# Patient Record
Sex: Male | Born: 1970 | Race: Black or African American | Hispanic: No | Marital: Single | Smoking: Never smoker
Health system: Southern US, Community
[De-identification: ages and names within clinical notes are randomized; demographics above are authoritative.]

## PROBLEM LIST (undated history)

## (undated) DIAGNOSIS — K219 Gastro-esophageal reflux disease without esophagitis: Secondary | ICD-10-CM

## (undated) DIAGNOSIS — G43909 Migraine, unspecified, not intractable, without status migrainosus: Secondary | ICD-10-CM

## (undated) DIAGNOSIS — R599 Enlarged lymph nodes, unspecified: Secondary | ICD-10-CM

## (undated) DIAGNOSIS — I309 Acute pericarditis, unspecified: Secondary | ICD-10-CM

## (undated) HISTORY — DX: Enlarged lymph nodes, unspecified: R59.9

## (undated) HISTORY — DX: Migraine, unspecified, not intractable, without status migrainosus: G43.909

## (undated) HISTORY — DX: Acute pericarditis, unspecified: I30.9

## (undated) HISTORY — DX: Gastro-esophageal reflux disease without esophagitis: K21.9

## (undated) HISTORY — PX: HERNIA REPAIR: SHX51

## (undated) HISTORY — PX: OTHER SURGICAL HISTORY: SHX169

---

## 1998-10-20 ENCOUNTER — Emergency Department (HOSPITAL_COMMUNITY): Admission: EM | Admit: 1998-10-20 | Discharge: 1998-10-20 | Payer: Self-pay | Admitting: Emergency Medicine

## 2001-10-25 ENCOUNTER — Emergency Department (HOSPITAL_COMMUNITY): Admission: EM | Admit: 2001-10-25 | Discharge: 2001-10-25 | Payer: Self-pay | Admitting: *Deleted

## 2001-10-25 ENCOUNTER — Encounter: Payer: Self-pay | Admitting: Emergency Medicine

## 2001-10-25 ENCOUNTER — Encounter: Payer: Self-pay | Admitting: Orthopedic Surgery

## 2001-10-28 ENCOUNTER — Ambulatory Visit (HOSPITAL_BASED_OUTPATIENT_CLINIC_OR_DEPARTMENT_OTHER): Admission: RE | Admit: 2001-10-28 | Discharge: 2001-10-28 | Payer: Self-pay | Admitting: Orthopedic Surgery

## 2002-11-06 ENCOUNTER — Emergency Department (HOSPITAL_COMMUNITY): Admission: EM | Admit: 2002-11-06 | Discharge: 2002-11-06 | Payer: Self-pay

## 2003-12-22 ENCOUNTER — Emergency Department (HOSPITAL_COMMUNITY): Admission: EM | Admit: 2003-12-22 | Discharge: 2003-12-22 | Payer: Self-pay | Admitting: Emergency Medicine

## 2003-12-24 ENCOUNTER — Emergency Department (HOSPITAL_COMMUNITY): Admission: EM | Admit: 2003-12-24 | Discharge: 2003-12-25 | Payer: Self-pay | Admitting: Emergency Medicine

## 2004-01-03 ENCOUNTER — Emergency Department (HOSPITAL_COMMUNITY): Admission: EM | Admit: 2004-01-03 | Discharge: 2004-01-03 | Payer: Self-pay | Admitting: Emergency Medicine

## 2004-02-05 ENCOUNTER — Observation Stay (HOSPITAL_COMMUNITY): Admission: EM | Admit: 2004-02-05 | Discharge: 2004-02-06 | Payer: Self-pay | Admitting: Emergency Medicine

## 2004-02-06 ENCOUNTER — Encounter: Payer: Self-pay | Admitting: Cardiology

## 2004-02-20 ENCOUNTER — Encounter: Admission: RE | Admit: 2004-02-20 | Discharge: 2004-02-20 | Payer: Self-pay | Admitting: Internal Medicine

## 2004-03-12 ENCOUNTER — Emergency Department (HOSPITAL_COMMUNITY): Admission: EM | Admit: 2004-03-12 | Discharge: 2004-03-12 | Payer: Self-pay | Admitting: Emergency Medicine

## 2004-03-19 ENCOUNTER — Emergency Department (HOSPITAL_COMMUNITY): Admission: EM | Admit: 2004-03-19 | Discharge: 2004-03-19 | Payer: Self-pay | Admitting: Emergency Medicine

## 2004-03-25 ENCOUNTER — Emergency Department (HOSPITAL_COMMUNITY): Admission: EM | Admit: 2004-03-25 | Discharge: 2004-03-26 | Payer: Self-pay | Admitting: *Deleted

## 2004-04-01 ENCOUNTER — Emergency Department (HOSPITAL_COMMUNITY): Admission: EM | Admit: 2004-04-01 | Discharge: 2004-04-02 | Payer: Self-pay | Admitting: Emergency Medicine

## 2004-04-14 ENCOUNTER — Emergency Department (HOSPITAL_COMMUNITY): Admission: EM | Admit: 2004-04-14 | Discharge: 2004-04-15 | Payer: Self-pay | Admitting: Emergency Medicine

## 2004-04-15 ENCOUNTER — Emergency Department (HOSPITAL_COMMUNITY): Admission: EM | Admit: 2004-04-15 | Discharge: 2004-04-15 | Payer: Self-pay

## 2004-04-16 ENCOUNTER — Encounter: Admission: RE | Admit: 2004-04-16 | Discharge: 2004-04-16 | Payer: Self-pay | Admitting: Internal Medicine

## 2004-04-21 ENCOUNTER — Emergency Department (HOSPITAL_COMMUNITY): Admission: EM | Admit: 2004-04-21 | Discharge: 2004-04-21 | Payer: Self-pay | Admitting: Emergency Medicine

## 2004-04-23 ENCOUNTER — Emergency Department (HOSPITAL_COMMUNITY): Admission: EM | Admit: 2004-04-23 | Discharge: 2004-04-23 | Payer: Self-pay | Admitting: Emergency Medicine

## 2004-05-12 ENCOUNTER — Encounter: Admission: RE | Admit: 2004-05-12 | Discharge: 2004-05-12 | Payer: Self-pay | Admitting: Internal Medicine

## 2004-05-12 ENCOUNTER — Ambulatory Visit (HOSPITAL_COMMUNITY): Admission: RE | Admit: 2004-05-12 | Discharge: 2004-05-12 | Payer: Self-pay | Admitting: Internal Medicine

## 2004-05-20 ENCOUNTER — Emergency Department (HOSPITAL_COMMUNITY): Admission: EM | Admit: 2004-05-20 | Discharge: 2004-05-20 | Payer: Self-pay | Admitting: Emergency Medicine

## 2004-05-21 ENCOUNTER — Emergency Department (HOSPITAL_COMMUNITY): Admission: EM | Admit: 2004-05-21 | Discharge: 2004-05-21 | Payer: Self-pay | Admitting: Emergency Medicine

## 2004-05-25 ENCOUNTER — Emergency Department (HOSPITAL_COMMUNITY): Admission: EM | Admit: 2004-05-25 | Discharge: 2004-05-25 | Payer: Self-pay | Admitting: Emergency Medicine

## 2004-07-03 ENCOUNTER — Encounter: Admission: RE | Admit: 2004-07-03 | Discharge: 2004-07-03 | Payer: Self-pay | Admitting: Neurology

## 2004-07-16 ENCOUNTER — Emergency Department (HOSPITAL_COMMUNITY): Admission: EM | Admit: 2004-07-16 | Discharge: 2004-07-16 | Payer: Self-pay

## 2004-08-08 ENCOUNTER — Emergency Department (HOSPITAL_COMMUNITY): Admission: EM | Admit: 2004-08-08 | Discharge: 2004-08-08 | Payer: Self-pay | Admitting: Emergency Medicine

## 2004-09-01 ENCOUNTER — Ambulatory Visit: Payer: Self-pay | Admitting: Internal Medicine

## 2004-09-04 ENCOUNTER — Ambulatory Visit: Payer: Self-pay | Admitting: Internal Medicine

## 2004-09-04 ENCOUNTER — Ambulatory Visit (HOSPITAL_COMMUNITY): Admission: RE | Admit: 2004-09-04 | Discharge: 2004-09-04 | Payer: Self-pay | Admitting: Internal Medicine

## 2004-10-08 ENCOUNTER — Ambulatory Visit: Payer: Self-pay | Admitting: Internal Medicine

## 2004-10-19 ENCOUNTER — Emergency Department (HOSPITAL_COMMUNITY): Admission: EM | Admit: 2004-10-19 | Discharge: 2004-10-19 | Payer: Self-pay | Admitting: Emergency Medicine

## 2004-10-22 ENCOUNTER — Ambulatory Visit: Payer: Self-pay | Admitting: Internal Medicine

## 2004-10-23 ENCOUNTER — Emergency Department (HOSPITAL_COMMUNITY): Admission: EM | Admit: 2004-10-23 | Discharge: 2004-10-23 | Payer: Self-pay | Admitting: Emergency Medicine

## 2004-10-28 ENCOUNTER — Ambulatory Visit: Payer: Self-pay | Admitting: Cardiology

## 2004-11-07 ENCOUNTER — Emergency Department (HOSPITAL_COMMUNITY): Admission: EM | Admit: 2004-11-07 | Discharge: 2004-11-07 | Payer: Self-pay | Admitting: Emergency Medicine

## 2004-11-15 ENCOUNTER — Emergency Department (HOSPITAL_COMMUNITY): Admission: EM | Admit: 2004-11-15 | Discharge: 2004-11-15 | Payer: Self-pay | Admitting: Emergency Medicine

## 2004-11-21 ENCOUNTER — Encounter: Admission: RE | Admit: 2004-11-21 | Discharge: 2004-11-21 | Payer: Self-pay | Admitting: Orthopaedic Surgery

## 2004-11-24 ENCOUNTER — Emergency Department (HOSPITAL_COMMUNITY): Admission: EM | Admit: 2004-11-24 | Discharge: 2004-11-24 | Payer: Self-pay | Admitting: Emergency Medicine

## 2004-11-25 ENCOUNTER — Encounter: Admission: RE | Admit: 2004-11-25 | Discharge: 2004-11-25 | Payer: Self-pay | Admitting: Neurology

## 2004-12-17 ENCOUNTER — Ambulatory Visit: Payer: Self-pay | Admitting: Internal Medicine

## 2004-12-31 ENCOUNTER — Ambulatory Visit: Payer: Self-pay | Admitting: Internal Medicine

## 2005-01-05 ENCOUNTER — Ambulatory Visit: Payer: Self-pay | Admitting: Cardiology

## 2005-02-19 ENCOUNTER — Emergency Department (HOSPITAL_COMMUNITY): Admission: EM | Admit: 2005-02-19 | Discharge: 2005-02-19 | Payer: Self-pay | Admitting: *Deleted

## 2005-02-25 ENCOUNTER — Ambulatory Visit: Payer: Self-pay | Admitting: Internal Medicine

## 2005-03-11 ENCOUNTER — Ambulatory Visit: Payer: Self-pay | Admitting: Cardiology

## 2005-03-18 ENCOUNTER — Ambulatory Visit: Payer: Self-pay | Admitting: Internal Medicine

## 2005-04-20 IMAGING — CR DG CHEST 2V
2 series · 2 of 2 positions shown · non-contrast
Comparison: 01/03/04 ([HOSPITAL])
 Heart and lungs are normal.  Osseous structures intact.
 IMPRESSION 
 No active disease.

CLINICAL DATA: Chest pain/dizzy.
 TWO VIEW CHEST

[view not recorded (1 of 2)]
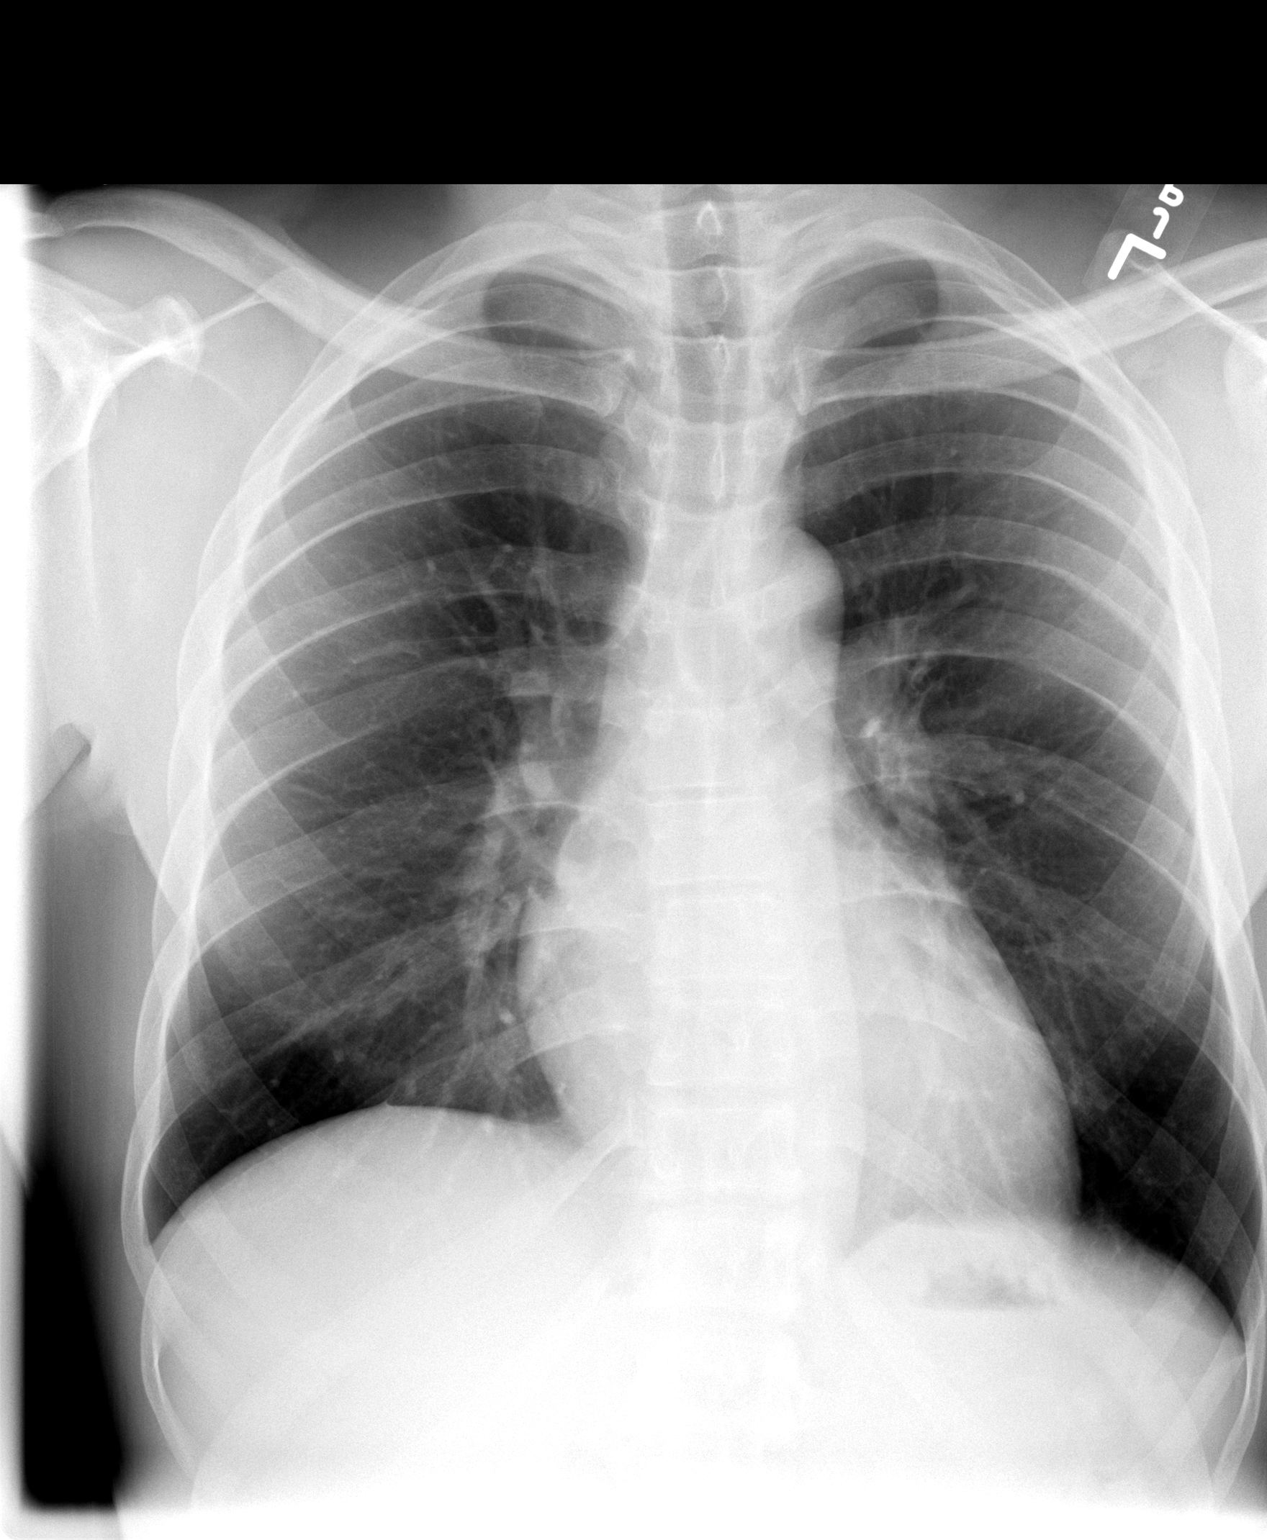

[view not recorded (2 of 2)]
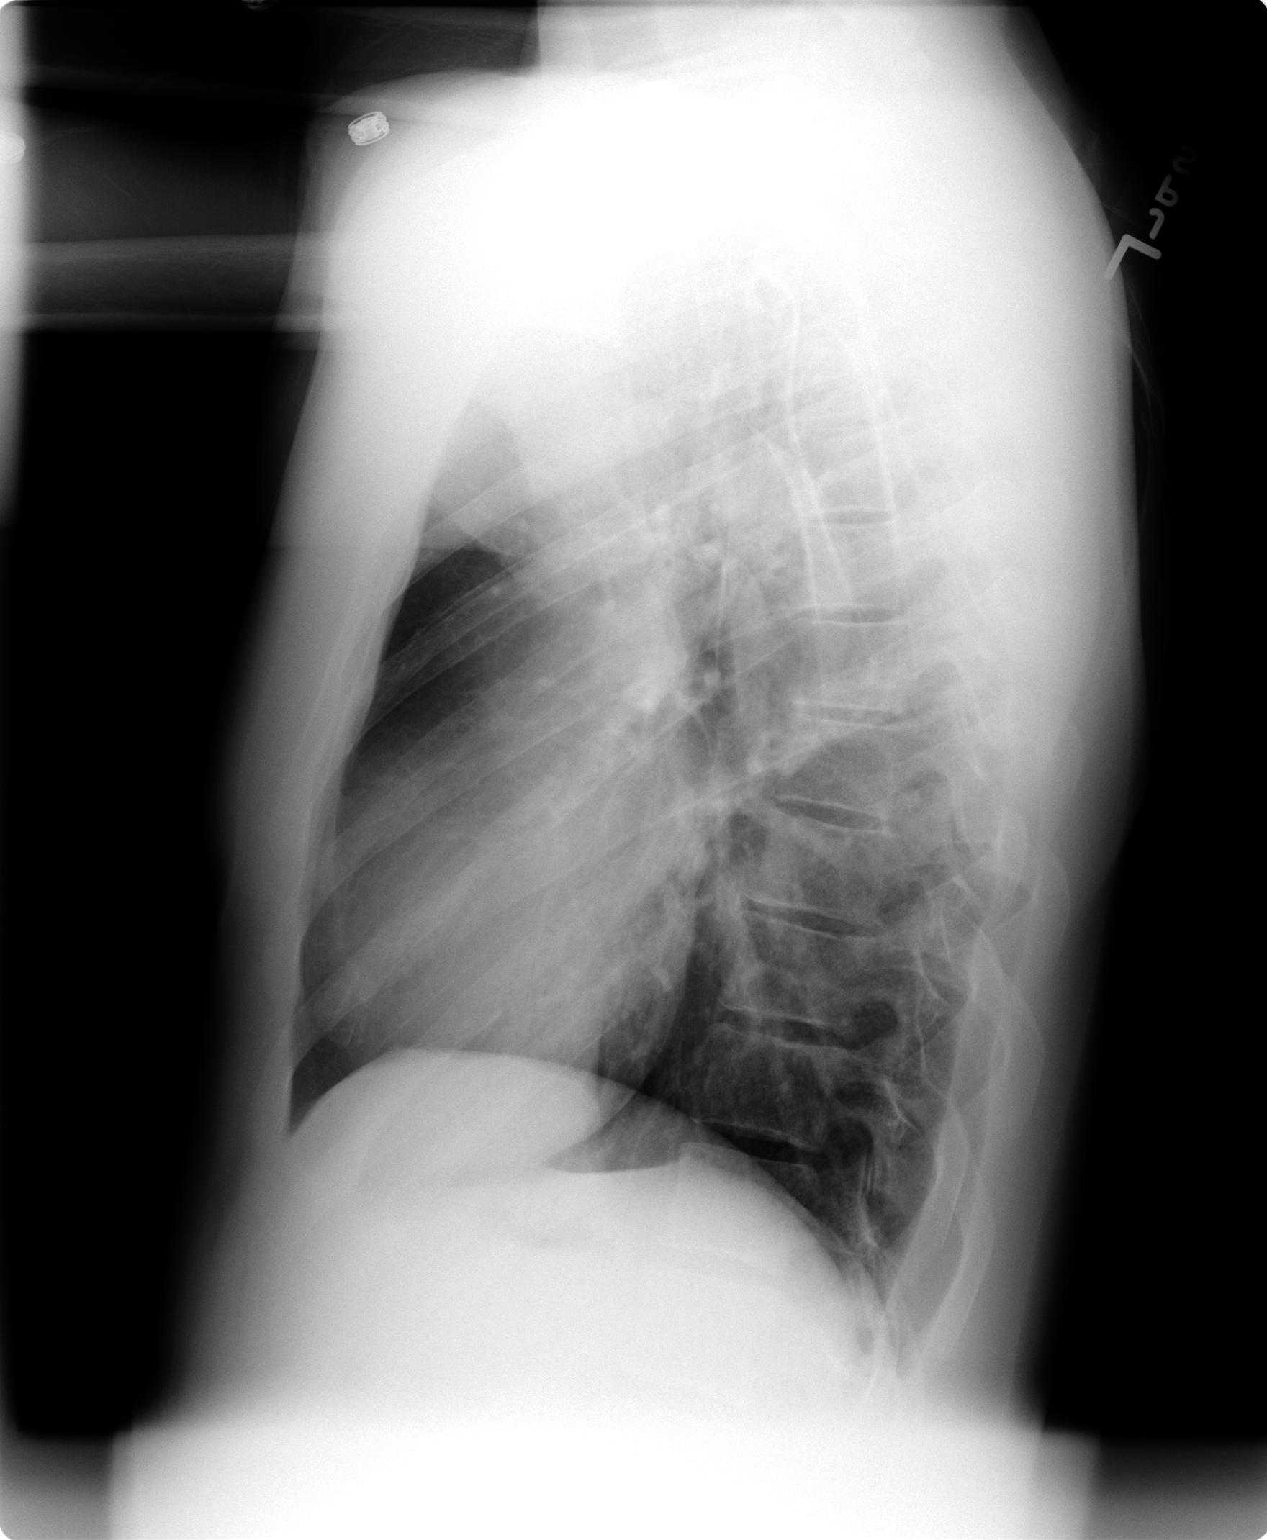

[2 of 2 positions shown; findings below may reference images not displayed]

## 2005-06-04 ENCOUNTER — Ambulatory Visit: Payer: Self-pay | Admitting: Cardiology

## 2005-06-08 ENCOUNTER — Ambulatory Visit: Payer: Self-pay | Admitting: Internal Medicine

## 2005-11-25 ENCOUNTER — Encounter: Payer: Self-pay | Admitting: Cardiology

## 2005-11-25 ENCOUNTER — Ambulatory Visit: Payer: Self-pay

## 2006-01-14 ENCOUNTER — Ambulatory Visit: Payer: Self-pay | Admitting: Cardiology

## 2006-06-01 ENCOUNTER — Ambulatory Visit: Payer: Self-pay | Admitting: Cardiology

## 2006-09-29 ENCOUNTER — Ambulatory Visit: Payer: Self-pay | Admitting: Cardiology

## 2006-11-11 DIAGNOSIS — Z8679 Personal history of other diseases of the circulatory system: Secondary | ICD-10-CM | POA: Insufficient documentation

## 2006-11-11 DIAGNOSIS — K219 Gastro-esophageal reflux disease without esophagitis: Secondary | ICD-10-CM | POA: Insufficient documentation

## 2006-11-11 DIAGNOSIS — I309 Acute pericarditis, unspecified: Secondary | ICD-10-CM

## 2006-11-11 DIAGNOSIS — G43909 Migraine, unspecified, not intractable, without status migrainosus: Secondary | ICD-10-CM

## 2006-11-11 DIAGNOSIS — R599 Enlarged lymph nodes, unspecified: Secondary | ICD-10-CM | POA: Insufficient documentation

## 2006-11-11 DIAGNOSIS — H1045 Other chronic allergic conjunctivitis: Secondary | ICD-10-CM | POA: Insufficient documentation

## 2006-11-11 HISTORY — DX: Migraine, unspecified, not intractable, without status migrainosus: G43.909

## 2007-08-13 ENCOUNTER — Encounter: Admission: RE | Admit: 2007-08-13 | Discharge: 2007-08-13 | Payer: Self-pay | Admitting: General Surgery

## 2008-06-08 ENCOUNTER — Ambulatory Visit: Payer: Self-pay | Admitting: Cardiology

## 2008-06-15 ENCOUNTER — Ambulatory Visit: Payer: Self-pay

## 2008-06-15 ENCOUNTER — Encounter: Payer: Self-pay | Admitting: Cardiology

## 2008-06-20 ENCOUNTER — Ambulatory Visit: Payer: Self-pay | Admitting: Cardiology

## 2009-02-21 DIAGNOSIS — R079 Chest pain, unspecified: Secondary | ICD-10-CM | POA: Insufficient documentation

## 2009-02-21 DIAGNOSIS — I080 Rheumatic disorders of both mitral and aortic valves: Secondary | ICD-10-CM | POA: Insufficient documentation

## 2009-02-21 HISTORY — DX: Chest pain, unspecified: R07.9

## 2009-02-22 ENCOUNTER — Encounter: Payer: Self-pay | Admitting: Cardiology

## 2009-02-22 ENCOUNTER — Ambulatory Visit: Payer: Self-pay | Admitting: Cardiology

## 2009-09-17 ENCOUNTER — Encounter: Admission: RE | Admit: 2009-09-17 | Discharge: 2009-09-17 | Payer: Self-pay | Admitting: Internal Medicine

## 2010-02-18 ENCOUNTER — Ambulatory Visit: Payer: Self-pay | Admitting: Cardiology

## 2010-02-18 DIAGNOSIS — R0602 Shortness of breath: Secondary | ICD-10-CM | POA: Insufficient documentation

## 2010-02-21 ENCOUNTER — Ambulatory Visit (HOSPITAL_COMMUNITY): Admission: RE | Admit: 2010-02-21 | Discharge: 2010-02-21 | Payer: Self-pay | Admitting: Cardiology

## 2010-02-21 ENCOUNTER — Ambulatory Visit: Payer: Self-pay | Admitting: Internal Medicine

## 2010-02-21 ENCOUNTER — Ambulatory Visit: Payer: Self-pay

## 2010-02-21 ENCOUNTER — Encounter: Payer: Self-pay | Admitting: Cardiology

## 2010-12-16 NOTE — Assessment & Plan Note (Signed)
Summary: 1 YR F/U   CC:  sob.  History of Present Illness: [name] [name] is a pleasant gentleman who has a history of pericarditis. His last echocardiogram was performed on June 15, 2008.  It was interpreted by Dr. Eden Emms.  There was no mention of LV function, but I did review it today, and his LV function appears to be normal.  There was moderate LVH.  There was trivial mitral regurgitation and mild tricuspid regurgitation.  It was felt that the pericardium was normal in appearance. I last saw the patient April of 2010. Since then he complains of dyspnea predominantly in the mornings when he wakes up. It lasts for 15 minutes and resolves spontaneously. There is no associated chest pain. He also has dyspnea with more extreme activities and relieved with rest. There is no associated chest pain. There is no orthopnea, PND, pedal edema, palpitations or syncope. He does not have exertional chest pain. There is no productive cough or hemoptysis.  Current Medications (verified): 1)  Multivitamins   Tabs (Multiple Vitamin) .Marland Kitchen.. 1 Tab Once Daily  Allergies: 1)  Isoniazid (Isoniazid)  Past History:  Past Medical History: MIGRAINE HEADACHE (ICD-346.90) PERICARDITIS, ACUTE (ICD-420.90) GERD (ICD-530.81) mildly reduced white blood cell count. Lymphadenopathy of neck and arms, associated with malaise, myalgias, and nausea, spx believed to be an intolerance to INH Positive PPD (INH trial failed b/c intolerance), had an LP 11/2004 w/ negative AFB, nl Glu. Done for workup of headaches. HIV test 12/2004 negative, RPR test 12/2004 negative, ANA 08/2004 negative, ACE 08/2004 negative, ESR 12/2004 low. All checked for pericarditis workup. type I Chiari malformation on MRI 06/2004, felt by Dr. Pearlean Brownie to be incidental finding  Past Surgical History: Reviewed history from 11/11/2006 and no changes required. hand fracture repair  Social History: Reviewed history from 02/21/2009 and no changes required.  He  has private insurance paying for his health care an prescriptions. Former Smoker Alcohol use-yes, occasional Drug use-no  Review of Systems       no fevers or chills, productive cough, hemoptysis, dysphasia, odynophagia, melena, hematochezia, dysuria, hematuria, rash, seizure activity, orthopnea, PND, pedal edema, claudication. Remaining systems are negative.   Vital Signs:  Patient profile:   40 year old male Height:      72 inches Weight:      183 pounds BMI:     24.91 Pulse rate:   61 / minute Resp:     14 per minute BP sitting:   131 / 87  (left arm)  Vitals Entered By: Kem Parkinson (February 18, 2010 8:59 AM)  Physical Exam  General:  Well-developed well-nourished in no acute distress.  Skin is warm and dry.  HEENT is normal.  Neck is supple. No thyromegaly.  Chest is clear to auscultation with normal expansion.  Cardiovascular exam is regular rate and rhythm.  Abdominal exam nontender or distended. No masses palpated. Extremities show no edema. neuro grossly intact    EKG  Procedure date:  02/18/2010  Findings:      Normal sinus rhythm at a rate of 61. No significant ST changes.  Impression & Recommendations:  Problem # 1:  DYSPNEA (ICD-786.05) Etiology unclear. Not volume overloaded on examination. I will check an echocardiogram to reassess LV function and to exclude constriction although I think this is unlikely. If negative he will followup with his primary care physician for further evaluation.  Problem # 2:  PERICARDITIS, ACUTE (ICD-420.90) No further symptoms of pericarditis.  Problem # 3:  POSITIVE  PPD (ICD-795.5) management per primary care.  Problem # 4:  GERD (ICD-530.81)  Other Orders: Echocardiogram (Echo)  Patient Instructions: 1)  Your physician recommends that you schedule a follow-up appointment in: one year 2)  Your physician has requested that you have an echocardiogram.  Echocardiography is a painless test that uses sound waves to  create images of your heart. It provides your doctor with information about the size and shape of your heart and how well your heart's chambers and valves are working.  This procedure takes approximately one hour. There are no restrictions for this procedure.

## 2010-12-16 NOTE — Assessment & Plan Note (Signed)
Summary: [facility]   Visit Type:  f/u 6 months  CC:  no energy, edema in anles during easter weekend, and ...pain on r side of neck (carotid area).  History of Present Illness: [name] [name] is a pleasant gentleman who has a history of pericarditis. His last echocardiogram was performed on June 15, 2008.  It was interpreted by Dr. Eden Emms.  There was no mention of LV function, but I did review it today, and his LV function appears to be normal.  There was moderate LVH.  There was trivial mitral regurgitation and mild tricuspid regurgitation.  It was felt that the pericardium was normal in appearance. I last saw the patient on June 20, 2008. Since that time he denies any dyspnea, chest pain or syncope. There is no palpitations. He did state that last week he had transient pedal edema which is now resolved. This is bilateral. He also states that he had a pain in his neck briefly. He felt that there may be some enlarged lymph nodes in his neck area and also in the left axillary area. He has not had fevers or chills or productive cough and has been no recent viral type symptoms.  Current Medications (verified): 1)  Visine-Ac 0.05-0.25 % Soln (Tetrahydrozoline-Zn Sulfate) .... Use 2 Drops in Each Eye Four Times A Day 2)  Multivitamins   Tabs (Multiple Vitamin) .Marland Kitchen.. 1 Tab Once Daily 3)  Bc Goodys Powder .... Use As Directed.as Needed 4)  Excedrin Extra Strength [phone] Mg Tabs (Aspirin-Acetaminophen-Caffeine) .... Use As Directed.as Needed  Allergies: 1)  Isoniazid (Isoniazid)  Past History:  Past Medical History:    Reviewed history from 02/21/2009 and no changes required:    Current Problems:     MITRAL REGURGITATION (ICD-396.3)    CHEST PAIN (ICD-786.50)    POSITIVE PPD (ICD-795.5)    LYMPHADENOPATHY (ICD-785.6)    CONJUNCTIVITIS, ALLERGIC (ICD-372.14)    MIGRAINE HEADACHE (ICD-346.90)    PERICARDITIS, ACUTE (ICD-420.90)    GERD (ICD-530.81)        mildly reduced white  blood cell count.    mitral regurgitation    mild  tricuspid regurgitation    GERD, stable on Omeprazole     Pericarditis, chronic (followed by Dr. Lance Morin); 2D ECHO 01/2004 was nl, EF=55-65%, mild Ao regurg                      EKG 04/2004: sinus brady w/ AVB I, inf-lat ST elevation    Migraine headaches, seen at [facility] by Dr. Pearlean Brownie who tapered his Depakote in 01/2005 since headaches seemed to be resolving    Allergic conjunctivitis    Lymphadenopathy of neck and arms, associated with malaise, myalgias, and nausea, spx believed to be an intolerance to INH    Positive PPD (INH trial failed b/c intolerance), had an LP 11/2004 w/ negative AFB, nl Glu. Done for workup of headaches.    folliculitis of scalp treated w/Doxycycline for 7 days    HIV test 12/2004 negative, RPR test 12/2004 negative, ANA 08/2004 negative, ACE 08/2004 negative, ESR 12/2004 low. All checked for pericarditis workup.    type I Chiari malformation on MRI 06/2004, felt by Dr. Pearlean Brownie to be incidental finding  Vital Signs:  Patient profile:   40 year old male Height:      72 inches Weight:      173 pounds BMI:     23.55 Pulse rate:   53 / minute Pulse rhythm:   regular BP  sitting:   125 / 83  (left arm)  Vitals Entered By: [name] [name], CMA (February 22, 2009 11:02 AM)  Physical Exam  Head:  HEENT is normal. Neck:  supple. I do not palpate enlarged lymph nodes. I also do not palpate axillary nodes. Lungs:  clear to auscultation Heart:  regular rate and rhythm with no murmurs noted. Abdomen:  soft Extremities:  no edema   EKG  Procedure date:  02/22/2009  Findings:      sinus bradycardia with first degree AV block. There is ST elevation diffusely.  Impression & Recommendations:  Problem # 1:  PERICARDITIS, ACUTE (ICD-420.90) The patient's electrocardiogram suggested possible pericarditis. However he is having no chest pain at present. His last echocardiogram showed no effusion. We will not pursue  this further at this point.  Problem # 2:  POSITIVE PPD (ICD-795.5) The patient has a history of a positive PPD. This is being managed by his primary care physician. The patient questioned whether he had enlarged lymph nodes recently. I do not appreciate this on examination. I have asked him to followup with his primary care doctor for this.  Problem # 3:  GERD (ICD-530.81) Management per primary care. The following medications were removed from the medication list:    Omeprazole 20 Mg Cpdr (Omeprazole) .Marland Kitchen... Take 1 capsule by mouth once a day  Patient Instructions: 1)  I will see the patient back in one year or sooner if necessary.

## 2011-03-31 NOTE — Assessment & Plan Note (Signed)
[facility]                            CARDIOLOGY OFFICE NOTE   NAME:[name] [name], [name] [name]                 MRN:          [id number]  DATE:06/08/2008                            DOB:          09/29/1971    Mr. Mader is a 36-year gentleman who has a history of pericarditis.  I last saw him in November 2007.  His most recent echocardiogram in  January 2007 showed normal LV function.  There was a mild mitral  regurgitation.  There was a question of increased thickness of the  pericardium posterior to the heart, but there was no constriction noted.  The patient has been treated with colchicine in the past for his  pericarditis, which had been a chronic issue.  The patient has done  well.  However, in the past 2 weeks, he has had intermittent chest pain.  The pain is in the left chest area and is described as an indigestion  type feeling.  It is not exertional.  It is not pleuritic or related to  food.  It can increase with lying flat by his report.  The pain does not  radiate.  There is no associated nausea, vomiting, or diaphoresis.  He  also has noticed mild increased dyspnea on exertion.  There is no  orthopnea, PND, pedal edema, palpitations, or syncope.  Note, he has not  had any fevers or chills and there is no recent URI.  His present  medications include Prilosec as well as vitamin D.   PHYSICAL EXAMINATION:  VITAL SIGNS:  Blood pressure of 124/78, the pulse  is 61, he weighs 175 pounds.  HEENT:  Normal.  NECK:  Supple.  CHEST:  Clear.  CARDIOVASCULAR:  Regular rate and rhythm.  ABDOMEN:  Bowel exam shows no tenderness.  EXTREMITIES:  No edema.     His electrocardiogram shows a sinus rhythm at a rate of 62.  The axis  is normal.  There are nonspecific ST changes.  It is not classic for  pericarditis.   DIAGNOSIS:  1. Chest pain - I wonder whether Mr. Bantz has developed recurrent      pericarditis, although his symptoms are not  classic.  I will treat      with Indocin 25 mg p.o. b.i.d. for 10 days.  I will also repeat his      echocardiogram.  We may need to consider a CT to look at his      pericardium in the future if it looks more thickened.  We will see      him back in approximately 2 to 4 weeks to review symptoms.  2. History of positive PPD.  3. History of mildly reduced white blood cell count.  4. Gastroesophageal reflux disease.     Madolyn Frieze Jens Som, MD, [facility]  Electronically Signed    BSC/MedQ  DD: 06/08/2008  DT: 06/09/2008  Job #: [id number]

## 2011-03-31 NOTE — Assessment & Plan Note (Signed)
[facility]                            CARDIOLOGY OFFICE NOTE   NAME:Gipe, GABRYEL FILES                 MRN:          [id number]  DATE:06/20/2008                            DOB:          [birth year]    [name] is a pleasant gentleman who has a history of pericarditis.  I recently saw him again on June 08, 2008.  At that time, he was  complaining of occasional chest pain.  We questioned whether he may  developed recurrent pericarditis, although we felt his symptoms were not  classic.  We gave him Indocin 25 mg p.o. b.i.d. for 10 days.  We also  repeated his echocardiogram.  This was performed on June 15, 2008.  It  was interpreted by Dr. Eden Emms.  There was no mention of LV function, but  I did review it today, and his LV function appears to be normal.  There  was moderate LVH.  There was trivial mitral regurgitation and mild  tricuspid regurgitation.  It was felt that the pericardium was normal in  appearance.  Since that time, he has had no further chest pain.  He  denies any dyspnea, palpitations, or syncope.  He did state that he has  had increased reflux on the Indocin.   PRESENT MEDICATIONS INCLUDE:  1. Prilosec.  2. Vitamin D.  3. Indocin 25 mg p.o. b.i.d.   PHYSICAL EXAMINATION:  VITAL SIGNS:  Blood pressure of 114/76 and his  pulse is 58.  He weighs 173 pounds.  HEENT:  Normal.  NECK:  Supple.  CHEST:  Clear.  CARDIOVASCULAR:  Regular rhythm.  ABDOMEN:  No tenderness.  EXTREMITIES:  No edema.   DIAGNOSES:  1. Recent chest pain - This may have been recurrent pericarditis, but      also may have been musculoskeletal.  It has now completely resolved      on the Indocin.  He will complete his 10-day course and then      discontinue.  I note his left ventricular function did not show      pericardial effusion and suggested probable normal pericardium.  We      will see him back in 6 months to make sure that he is stable.  2.  History of positive purified protein derivative (of tuberculin) -      Management per his primary care physician.  3. History of mildly reduced white blood cell count.  4. Gastroesophageal reflux disease.     Madolyn Frieze Jens Som, MD, [facility]  Electronically Signed   BSC/MedQ  DD: 06/20/2008  DT: 06/20/2008  Job #: [id number]

## 2011-04-03 NOTE — Assessment & Plan Note (Signed)
[facility]                              CARDIOLOGY OFFICE NOTE   NAME:[name] [name], [name] [name]                 MRN:          [id number]  DATE:06/01/2006                            DOB:          01-Nov-1971    [name] [name] is a pleasant gentleman who has a history of pericarditis.  Since I last saw him he, apparently over the past week, has developed pain  in the left chest area predominantly in the left axilla.  It increases with  lying flat and standing up at times.  It is not exertional nor is it  pleuritic or positional.  It is not related to food.  There is no dyspnea or  pedal edema and no syncope.  There is no fevers.   MEDICATIONS:  Prilosec.   PHYSICAL EXAMINATION:  VITAL SIGNS:  Blood pressure 120/88 and his pulse is  63.  NECK:  Supple.  There are no bruits.  There is no jugular venous distention.  CHEST:  Clear to auscultation.  Normal expansion.  CARDIOVASCULAR:  Regular rate and rhythm.  There is a 1/6 systolic ejection  murmur at the left sternal border.  I cannot appreciate a rub.  ABDOMEN:  Benign.  EXTREMITIES:  No edema.   His electrocardiogram today shows a normal sinus rhythm at a rate of 63.  There is a first degree AV block.  There is diffuse ST elevation.  It is  unchanged from his previous electrocardiogram in March.   DIAGNOSES:  1.  History of chronic pericarditis.  2.  History of positive PPD.  3.  History of mildly decreased white blood cell count.  4.  Gastroesophageal reflux disease.   PLAN:  [name] [name] is complaining of pain in the left axillary area.  It  is not classic for pericarditis but there are some symptoms consistent with  this including the pain increasing with lying flat.  Will give him a  prescription for Indocin 25 mg p.o. b.i.d. to see if this improves his  symptoms.  Note, we did perform an echocardiogram in January that showed  normal LV function, no pericardial effusion.  There was increased  thickness  of the pericardium posterior to the heart but there was no evidence of  constriction.  I will see him back in six weeks to see if his symptoms have  improved.                             Madolyn Frieze Jens Som, MD, [facility]    BSC/MedQ  DD:  06/01/2006  DT:  06/01/2006  Job #:  [id number]

## 2011-04-03 NOTE — Assessment & Plan Note (Signed)
[facility]                              CARDIOLOGY OFFICE NOTE   NAME:[name], [name] RUBEN                 MRN:          [id number]  DATE:09/29/2006                            DOB:          1971/07/12    [name] is a pleasant gentleman who has a history of chronic  pericarditis.  Since I last saw him, however, he is doing very well.  There  is no dyspnea, chest pain, palpitations, or syncope.  His medications  include Prilosec.   PHYSICAL EXAM:  Blood pressure 125/76, and his pulse is 65.  NECK:  Supple with no jugular venous distension.  CHEST:  Clear.  CARDIOVASCULAR:  Regular rate and rhythm.  EXTREMITIES:  No edema.   ELECTROCARDIOGRAM:  Sinus rhythm at a rate of 66.  There are nonspecific ST  changes.   DIAGNOSES:  1. History of chronic pericarditis.  2. History of positive predicted value.  3. History of mildly decreased white blood cell count.  4. Gastroesophageal reflux disease.   PLAN:  [name] is doing well from a symptomatic standpoint.  He has  had no chest pain or shortness of breath.  We will, therefore, not change  medications at this point.  I will see him back in 1 year.  We will most-  likely repeat his echocardiogram at that time to make sure he is not  developing evidence of constriction.     Madolyn Frieze Jens Som, MD, [facility]  Electronically Signed    BSC/MedQ  DD: 09/29/2006  DT: 09/29/2006  Job #: [id number]

## 2011-04-03 NOTE — Consult Note (Signed)
. [facility]  Patient:    [name] [name], [name] [name] Visit Number: [id number] MRN: [id number]          Service Type: EMS Location: [facility] Attending Physician:  Corlis Leak. Dictated by:   Katy Fitch Naaman Plummer., M.D. Proc. Date: 10/25/01 Admit Date:  10/25/2001                            Consultation Report  CHIEF COMPLAINT:  Pain and deformity, right hand with clinical evidence based on x-rays in the emergency room of fracture dislocation of right ring and small finger carpometacarpal joints with displaced fracture of hamate.  HISTORY:  The patient is a 40 year old right-handed gentleman who is employed in a [employer] who last evening punched a bulletin board sustaining acute injury to the right ring and small finger carpometacarpal joints.  He developed significant swelling and pain and loss of grip strength. He completed his work shift and presented at the [facility] [facility] [employer] [facility] Emergency Department on October 25, 2001.  Clinical examination at that time suggested a fracture of his ring and small finger metacarpal bases.  Plain films demonstrated fracture dislocations of the ring and small finger CMC joints with a questionable malrotation of the hamate. He was referred for CT scanning and 3D reconstruction of his carpometacarpal joints.  These revealed displaced fractures through the articular surfaces of the hamate with dorsal dislocation of the ring and small finger metacarpals.  An upper extremity orthopedic consult was requested.  His past medical history in brief revealed that he had no drug allergies.  He is on no current medications.  He denied numbness in his hand and did not have any signs of vascular impairment.  Informed consent was completed recommending that we perform closed reduction at this time.  However, given the nature of this unstable injury due to the pull of the ulnar wrist extensor on the base of  the small finger metacarpal, it is likely that he will require closed or open reduction and internal fixation with either Kirschner wire fixation or screw fixation to maintain reduction of this injury.  DESCRIPTION OF PROCEDURE:  The patient was placed on a gurney in the emergency room and advised as to the anticipated procedure.  After routine Betadine prep, 8 cc of 2% lidocaine were infiltrated in the path of the ulnar dorsal sensory branch and the ulnar nerve proper adjacent to the flexor carpi ulnaris.  After approximately 15 minutes he had an excellent ulnar nerve block with motor paresis of his ulnar enervated intrinsics and excellent anesthesia of the ring and small fingers.  He was placed in finger traps with 10 pounds of traction applied to the proximal brachium.  After approximately five minutes of traction we easily closed reduced the carpometacarpal joints.  He was placed in a safe position, sugar-tong splint with his wrist in 30 degrees dorsiflexion, his MP joints flexed to 70 degrees, IP joint was in full extension.  Post reduction films demonstrated that his ring finger carpometacarpal joint was anatomic.  The small finger carpometacarpal joint remained slightly dorsally subluxed due to the fracture through the facet of his hamate.  We will schedule him for elective closed or open reduction followed by appropriate internal fixation with either Kirschner wires or screw fixation within 72 hours.  For aftercare he was given prescriptions for Percocet 5/325 1 or 2 tablets p.o. q.4-6h. p.r.n. pain.  20 tablets without refill, also  Motrin 600 mg 1 p.o. q.6h. p.r.n. pain.  We will contact him by phone to arrange his outpatient surgery. Dictated by:   Katy Fitch Naaman Plummer., M.D. Attending Physician:  Corlis Leak DD:  10/25/01 TD:  10/25/01 Job: [id number]

## 2011-04-03 NOTE — Discharge Summary (Signed)
NAMEMarland [name]  [name], [name]                    ACCOUNT NO.:  [id number]   MEDICAL RECORD NO.:  [id number]                   PATIENT TYPE:  OBV   LOCATION:  2025                                 FACILITY:  [facility]   PHYSICIAN:  Fransisco Hertz, M.D.               DATE OF BIRTH:  [birth year]   DATE OF ADMISSION:  02/05/2004  DATE OF DISCHARGE:  02/06/2004                                 DISCHARGE SUMMARY   DISCHARGE DIAGNOSES:  1. Chest pain, likely secondary to pericarditis (viral etiology).  2. History of recent upper respiratory tract infection.   DISCHARGE MEDICATIONS:  1. Ibuprofen 800 mg 1 p.o. q.8h. for 5 days.  2. Pepcid 40 mg 1 p.o. q.h.s. for 2 weeks.   DISPOSITION AND FOLLOW UP:  The patient is to be followed up at the [facility] outpatient clinic on April 6 at 3:15 p.m. The pertinent reason for this  followup would be to ensure proper resolution of his chest pain that was  present and to make sure that he is not developing cardiac tamponade.   CONSULTATIONS:  No major consultation was sought during this admission.   PROCEDURE:  The major procedures performed were as follows:  On February 05, 2004, a chest x-ray was done that showed no active disease. On February 06, 2004, a two-dimensional transthoracic echocardiogram was done that showed an  overall left ventricular systolic function that was normal with a left  ventricular ejection fraction estimated to be between 55 and 65%. There was  no diagnostic evidence of left ventricular regional wall abnormalities.  There was mild aortic valvular regurgitation. No pericardial effusion was  noted.   HISTORY OF PRESENT ILLNESS:  This is a 40 year old African-American  gentleman who presents with substernal chest pain that is of spontaneous  onset and associated with palpitations. He had no associated radiation of  his chest pain to arms, neck or back, and no associated fever, chills, or  cough. He notes, however, that he had an  upper respiratory tract infection  that was treated symptomatically with over-the-counter medications two weeks  ago. The patient notes that the pain that is present in his chest is not  better or worse on lying flat or sitting forward.   ALLERGIES:  The patient has no known drug allergies.   PAST MEDICAL HISTORY:  Significant for fracture of his right where he  fractured his fourth and fifth digits two years ago in a work-related  accident.   MEDICATIONS:  He has no current medications.   SOCIAL HISTORY:  He has never smoked in the past, consumes alcohol on a  social basis, and does not use any other drugs. He is single and works on  third shift. He has private insurance paying for his health care and  prescriptions.   REVIEW OF SYSTEMS:  This is significant for fatigue, chest pain,  palpitations, dyspnea, dyspnea on exertion, weakness, and dizziness.  PHYSICAL EXAMINATION:  VITAL SIGNS:  He has a pulse of 60, blood pressure  128/79, temperature of 98.2, respirations 18, oxygen saturation 100% on room  air.  GENERAL:  He is anxious in appearance.  HEENT:  On examination of his eyes, his pupils are bilaterally equally  reactive to light, and he has normal external ocular muscle movement. On ENT  evaluation, his throat is normal on exam.  NECK:  On examination of his neck, this is supple, and there is no goiter  palpable.  LUNGS:  ON examination of his respiratory system, both lung fields are clear  to auscultation.  CARDIOVASCULAR:  On examination of his cardiovascular system, his pulses is  regular in rate and rhythm. Heart sounds S1 and S2 were normal. No rubs,  gallops, or murmurs auscultated.  ABDOMEN:  On examination of his GI system, his abdomen is soft, flat,  nontender, nondistended. Other systemic exams are noncontributory.   LABORATORY DATA:  Admission labs are as follows:  Sodium 138, potassium 3.9,  chloride 102, bicarb 26, BUN 13, creatinine 1.0, and glucose 71.  Total  protein 6.8, albumin 4.0, SGOT 34, SGPT of 33, alkaline phosphatase of 61,  and total bilirubin of 0.9. His white cell count is at 3.6 with absolute  neutrophils of 2.1. Hemoglobin of 16.7 with a platelet count of 173. An  erythrocyte sedimentation rate that was done on admission was at 0 mm per  hour. A point of care cardiac marker was also done in the emergency room,  and this was negative for any acute myocardial injury. On EKG that was done  during admission showed a diffuse ST elevation of around 1 mm with PR  prolongation and a normal axis and width.   HOSPITAL COURSE:  Substernal chest pain. Based on the facts, this patient  had negative cardiac enzymes suggestive of acute myocardial injury and a  negative chest x-ray suggestive of a pulmonary process. Based on the fact  that he had a viral URI almost two weeks prior to this substernal chest  pain, this was most likely to be viral pericarditis. The patient had no  associated rubs or pericardial effusion that was noted on 2-D transthoracic  echocardiogram, and subsequently was treated symptomatically with ibuprofen  by mouth. Steroids were not felt warranted as his effusion was not present,  and his symptoms were not severe enough to suggest an autoimmune process.  Rheumatic factor levels were also screened, and rheumatoid factor was at  less than 20. The patient was consequently discharged on ibuprofen 800 mg  t.i.d. and will be followed up on the same to ensure proper resolution of  his symptoms.      Zetta Bills, MD                             Fransisco Hertz, M.D.    JP/MEDQ  D:  02/26/2004  T:  02/27/2004  Job:  [id number]
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
## 2012-01-15 ENCOUNTER — Ambulatory Visit
Admission: RE | Admit: 2012-01-15 | Discharge: 2012-01-15 | Disposition: A | Discharge status: Home / Self Care | Payer: BC Managed Care – PPO | Source: Ambulatory Visit | Attending: Family Medicine | Admitting: Family Medicine

## 2012-01-15 ENCOUNTER — Other Ambulatory Visit: Payer: Self-pay | Admitting: Family Medicine

## 2012-01-15 DIAGNOSIS — M542 Cervicalgia: Secondary | ICD-10-CM

## 2012-01-22 ENCOUNTER — Other Ambulatory Visit: Payer: Self-pay | Admitting: Family Medicine

## 2012-01-22 DIAGNOSIS — G44009 Cluster headache syndrome, unspecified, not intractable: Secondary | ICD-10-CM

## 2012-01-26 ENCOUNTER — Ambulatory Visit
Admission: RE | Admit: 2012-01-26 | Discharge: 2012-01-26 | Disposition: A | Discharge status: Home / Self Care | Payer: BC Managed Care – PPO | Source: Ambulatory Visit | Attending: Family Medicine | Admitting: Family Medicine

## 2012-01-26 DIAGNOSIS — G44009 Cluster headache syndrome, unspecified, not intractable: Secondary | ICD-10-CM

## 2012-02-10 ENCOUNTER — Encounter: Payer: Self-pay | Admitting: *Deleted

## 2012-02-11 ENCOUNTER — Encounter: Payer: Self-pay | Admitting: Cardiology

## 2012-02-11 ENCOUNTER — Ambulatory Visit (INDEPENDENT_AMBULATORY_CARE_PROVIDER_SITE_OTHER): Payer: BC Managed Care – PPO | Admitting: Cardiology

## 2012-02-11 VITALS — BP 123/83 | HR 64 | Ht 71.0 in | Wt 184.0 lb

## 2012-02-11 DIAGNOSIS — R079 Chest pain, unspecified: Secondary | ICD-10-CM

## 2012-02-11 NOTE — Patient Instructions (Signed)
Your physician wants you to follow-up in: ONE YEAR You will receive a reminder letter in the mail two months in advance. If you don't receive a letter, please call our office to schedule the follow-up appointment.   Your physician has requested that you have an echocardiogram. Echocardiography is a painless test that uses sound waves to create images of your heart. It provides your doctor with information about the size and shape of your heart and how well your heart's chambers and valves are working. This procedure takes approximately one hour. There are no restrictions for this procedure.   TAKE MOTRIN AS NEEDED

## 2012-02-11 NOTE — Progress Notes (Signed)
   HPI:[name] [name] is a pleasant gentleman who has a history of pericarditis. His last echocardiogram was performed in April of 2011. There was normal LV function and mild biatrial enlargement. I last saw the patient April of 2011. Since then the patient has had 2 episodes of chest pain with lying on his back or on his left side. This lasted approximately 10-15 minutes and resolved spontaneously. He denies dyspnea on exertion, orthopnea, PND, pedal edema, syncope.  Current Outpatient Prescriptions  Medication Sig Dispense Refill  . Cholecalciferol (VITAMIN D) 2000 UNITS CAPS Take 1 capsule by mouth daily.      Marland Kitchen esomeprazole (NEXIUM) 40 MG capsule 1 tab po twice a week      . Multiple Vitamin (MULTIVITAMIN) capsule Take 1 capsule by mouth daily.         Past Medical History  Diagnosis Date  . MIGRAINE HEADACHE   . PERICARDITIS, ACUTE   . GERD   . LYMPHADENOPATHY   . POSITIVE PPD     Past Surgical History  Procedure Date  . Hand fracture repair     History   Social History  . Marital Status: Single    Spouse Name: N/A    Number of Children: N/A  . Years of Education: N/A   Occupational History  . Not on file.   Social History Main Topics  . Smoking status: Never Smoker   . Smokeless tobacco: Not on file  . Alcohol Use: Not on file  . Drug Use: Not on file  . Sexually Active: Not on file   Other Topics Concern  . Not on file   Social History Narrative  . No narrative on file    ROS: no fevers or chills, productive cough, hemoptysis, dysphasia, odynophagia, melena, hematochezia, dysuria, hematuria, rash, seizure activity, orthopnea, PND, pedal edema, claudication. Remaining systems are negative.  Physical Exam: Well-developed well-nourished in no acute distress.  Skin is warm and dry.  HEENT is normal.  Neck is supple. No thyromegaly.  Chest is clear to auscultation with normal expansion.  Cardiovascular exam is regular rate and rhythm.  Abdominal exam  nontender or distended. No masses palpated. Extremities show no edema. neuro grossly intact  ECG normal sinus rhythm, nonspecific ST changes.

## 2012-02-11 NOTE — Assessment & Plan Note (Signed)
Symptoms are somewhat atypical and I am not convinced he is having recurrent pericarditis. He will take Motrin as needed. Repeat echocardiogram. Followup sooner if symptoms worsen.

## 2012-02-24 ENCOUNTER — Other Ambulatory Visit (HOSPITAL_COMMUNITY): Payer: BC Managed Care – PPO

## 2012-03-03 ENCOUNTER — Other Ambulatory Visit: Payer: Self-pay

## 2012-03-03 ENCOUNTER — Ambulatory Visit (HOSPITAL_COMMUNITY): Payer: BC Managed Care – PPO | Attending: Cardiology

## 2012-03-03 DIAGNOSIS — I517 Cardiomegaly: Secondary | ICD-10-CM | POA: Insufficient documentation

## 2012-03-03 DIAGNOSIS — R079 Chest pain, unspecified: Secondary | ICD-10-CM

## 2012-03-03 DIAGNOSIS — R072 Precordial pain: Secondary | ICD-10-CM

## 2012-06-10 ENCOUNTER — Other Ambulatory Visit: Payer: Self-pay | Admitting: Family Medicine

## 2012-06-10 DIAGNOSIS — R1031 Right lower quadrant pain: Secondary | ICD-10-CM

## 2012-06-14 ENCOUNTER — Inpatient Hospital Stay: Admission: RE | Admit: 2012-06-14 | Payer: BC Managed Care – PPO | Source: Ambulatory Visit

## 2012-06-22 ENCOUNTER — Ambulatory Visit
Admission: RE | Admit: 2012-06-22 | Discharge: 2012-06-22 | Disposition: A | Discharge status: Home / Self Care | Payer: BC Managed Care – PPO | Source: Ambulatory Visit | Attending: Family Medicine | Admitting: Family Medicine

## 2012-06-22 ENCOUNTER — Other Ambulatory Visit: Payer: BC Managed Care – PPO

## 2012-06-22 DIAGNOSIS — R1031 Right lower quadrant pain: Secondary | ICD-10-CM

## 2012-06-22 MED ORDER — IOHEXOL 300 MG/ML  SOLN
100.0000 mL | Freq: Once | INTRAMUSCULAR | Status: AC | PRN
  Administered 2012-06-22: 100 mL via INTRAVENOUS

## 2012-07-28 ENCOUNTER — Encounter (INDEPENDENT_AMBULATORY_CARE_PROVIDER_SITE_OTHER): Payer: BC Managed Care – PPO | Admitting: General Surgery

## 2012-08-26 ENCOUNTER — Encounter (INDEPENDENT_AMBULATORY_CARE_PROVIDER_SITE_OTHER): Payer: BC Managed Care – PPO | Admitting: General Surgery

## 2012-09-14 ENCOUNTER — Encounter (INDEPENDENT_AMBULATORY_CARE_PROVIDER_SITE_OTHER): Payer: BC Managed Care – PPO | Admitting: General Surgery

## 2012-10-25 ENCOUNTER — Encounter (INDEPENDENT_AMBULATORY_CARE_PROVIDER_SITE_OTHER): Payer: BC Managed Care – PPO | Admitting: General Surgery

## 2012-11-01 ENCOUNTER — Encounter (INDEPENDENT_AMBULATORY_CARE_PROVIDER_SITE_OTHER): Payer: BC Managed Care – PPO | Admitting: General Surgery

## 2012-11-29 ENCOUNTER — Encounter (INDEPENDENT_AMBULATORY_CARE_PROVIDER_SITE_OTHER): Payer: BC Managed Care – PPO | Admitting: General Surgery

## 2012-11-30 ENCOUNTER — Encounter (INDEPENDENT_AMBULATORY_CARE_PROVIDER_SITE_OTHER): Payer: Self-pay | Admitting: General Surgery

## 2012-12-06 ENCOUNTER — Emergency Department (HOSPITAL_COMMUNITY)
Admission: EM | Admit: 2012-12-06 | Discharge: 2012-12-06 | Disposition: A | Discharge status: Home / Self Care | Payer: BC Managed Care – PPO | Attending: Emergency Medicine | Admitting: Emergency Medicine

## 2012-12-06 DIAGNOSIS — K219 Gastro-esophageal reflux disease without esophagitis: Secondary | ICD-10-CM | POA: Insufficient documentation

## 2012-12-06 DIAGNOSIS — S46909A Unspecified injury of unspecified muscle, fascia and tendon at shoulder and upper arm level, unspecified arm, initial encounter: Secondary | ICD-10-CM | POA: Insufficient documentation

## 2012-12-06 DIAGNOSIS — Z8679 Personal history of other diseases of the circulatory system: Secondary | ICD-10-CM | POA: Insufficient documentation

## 2012-12-06 DIAGNOSIS — Z9889 Other specified postprocedural states: Secondary | ICD-10-CM | POA: Insufficient documentation

## 2012-12-06 DIAGNOSIS — S4980XA Other specified injuries of shoulder and upper arm, unspecified arm, initial encounter: Secondary | ICD-10-CM | POA: Insufficient documentation

## 2012-12-06 DIAGNOSIS — Y9389 Activity, other specified: Secondary | ICD-10-CM | POA: Insufficient documentation

## 2012-12-06 DIAGNOSIS — Y92410 Unspecified street and highway as the place of occurrence of the external cause: Secondary | ICD-10-CM | POA: Insufficient documentation

## 2012-12-06 MED ORDER — METHOCARBAMOL 500 MG PO TABS: 500.0000 mg | ORAL_TABLET | Freq: Two times a day (BID) | ORAL | Status: DC

## 2012-12-06 MED ORDER — IBUPROFEN 800 MG PO TABS: 800.0000 mg | ORAL_TABLET | Freq: Three times a day (TID) | ORAL | Status: DC

## 2012-12-06 NOTE — ED Provider Notes (Signed)
Medical screening examination/treatment/procedure(s) were performed by non-physician practitioner and as supervising physician I was immediately available for consultation/collaboration.   Gwyneth Sprout, MD 12/06/12 2330

## 2012-12-06 NOTE — ED Notes (Signed)
Pt verbalizes understanding 
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
## 2013-09-05 ENCOUNTER — Encounter (INDEPENDENT_AMBULATORY_CARE_PROVIDER_SITE_OTHER): Payer: BC Managed Care – PPO | Admitting: General Surgery

## 2013-09-05 IMAGING — CT CT ABDOMEN W/ CM
2 of 4 series · 17 of 46 positions shown, 19 images · IV contrast (READICAT/WATER & [ID] OMNI 300)
Comparison: CT 08/13/2007

CLINICAL DATA: Right lower quadrant abdominal pain

CT ABDOMEN WITH CONTRAST
TECHNIQUE: Multidetector CT imaging of the abdomen was performed
following the standard protocol during bolus administration of
intravenous contrast.
Contrast: 100mL OMNIPAQUE IOHEXOL 300 MG/ML  SOLN

[Series 2: abdomen w/ · axial · 0.70mm/px · z∈[-265,-10]mm · 14 of 57 slices shown, 16 images]
[im 3/57  soft-tissue]
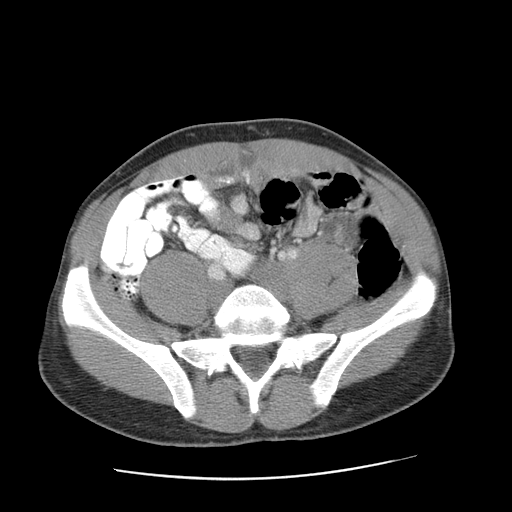
[im 3/57  bone]
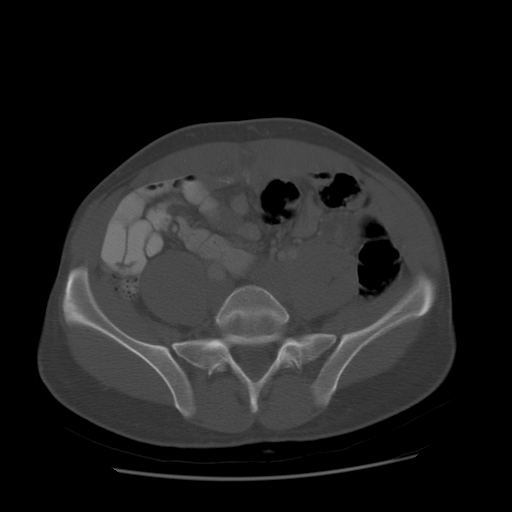
[im 9/57  soft-tissue]
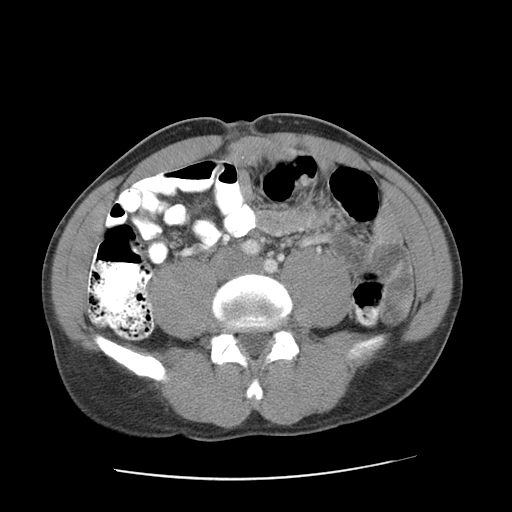
[im 12/57  soft-tissue]
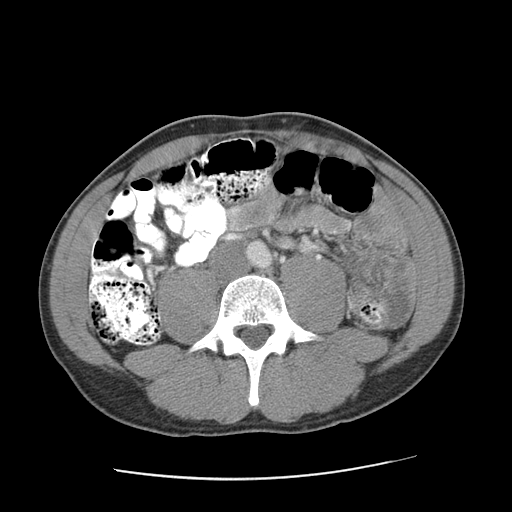
[im 15/57  soft-tissue]
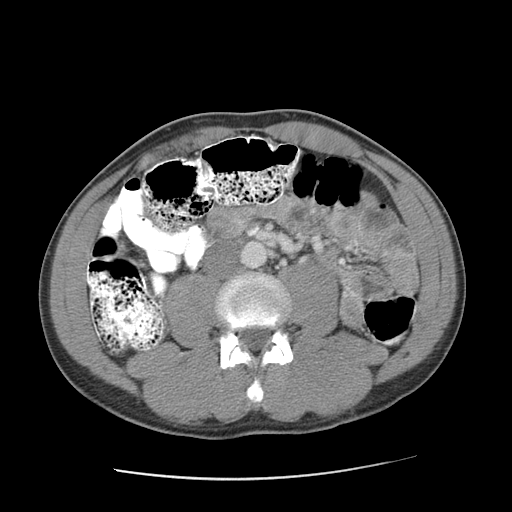
[im 20/57  soft-tissue]
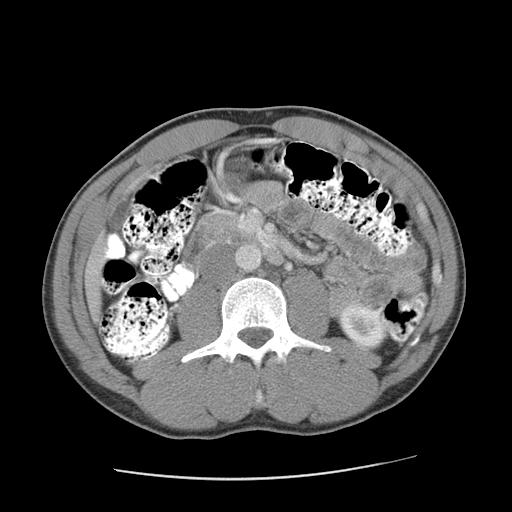
[im 23/57  soft-tissue]
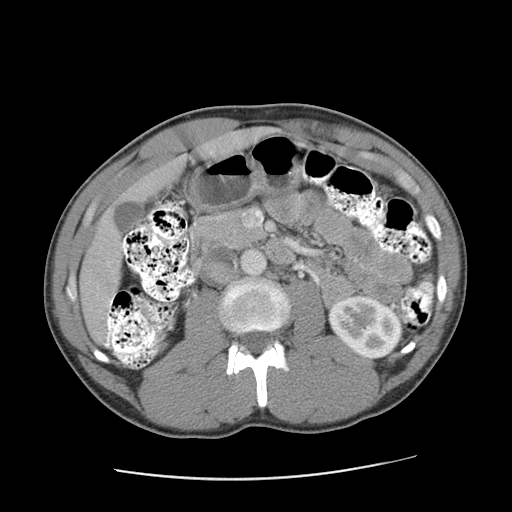
[im 26/57  soft-tissue]
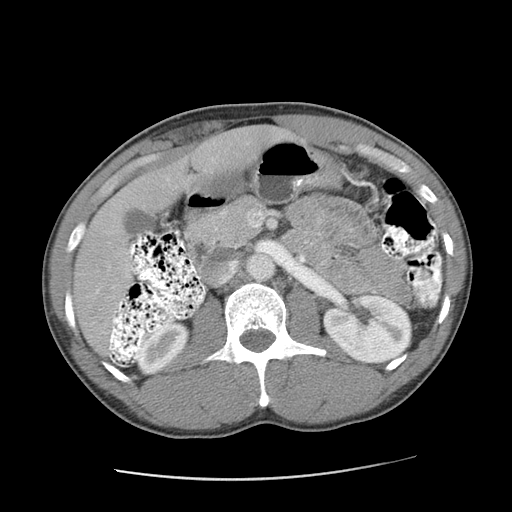
[im 31/57  soft-tissue]
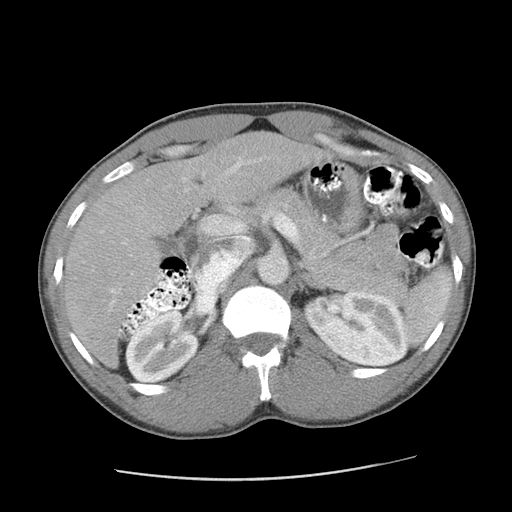
[im 34/57  soft-tissue]
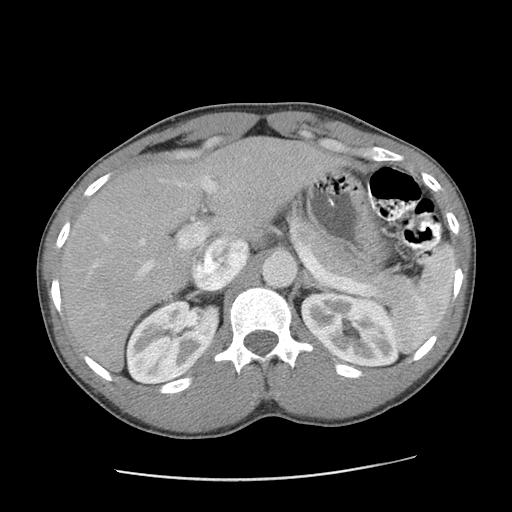
[im 34/57  bone]
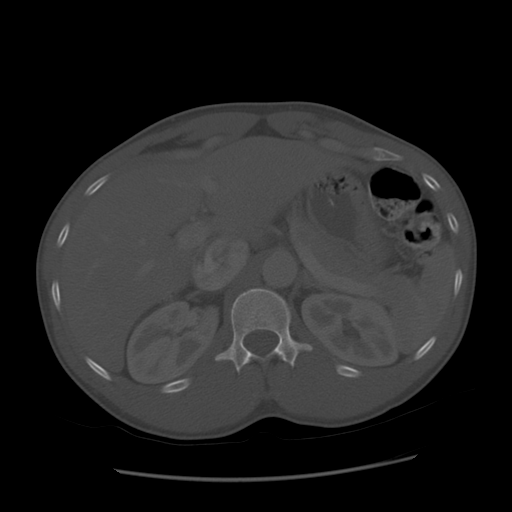
[im 37/57  soft-tissue]
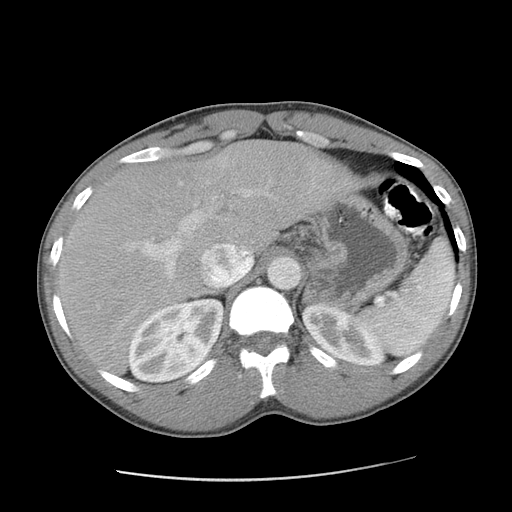
[im 43/57  soft-tissue]
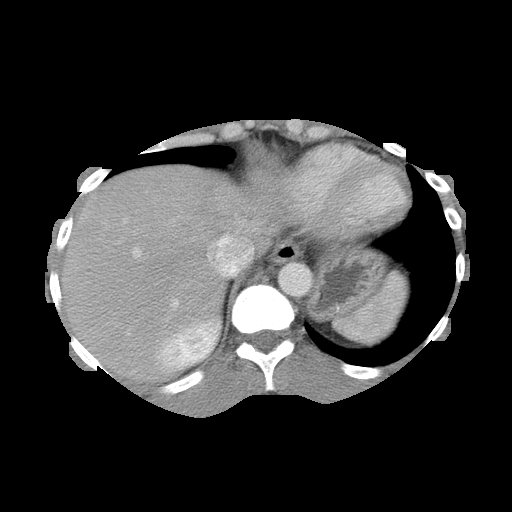
[im 45/57  soft-tissue]
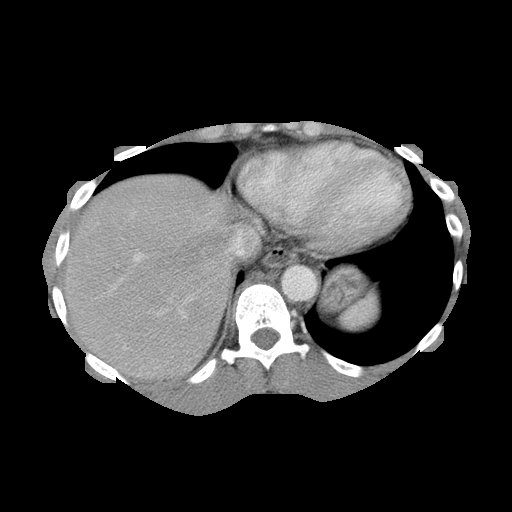
[im 48/57  soft-tissue]
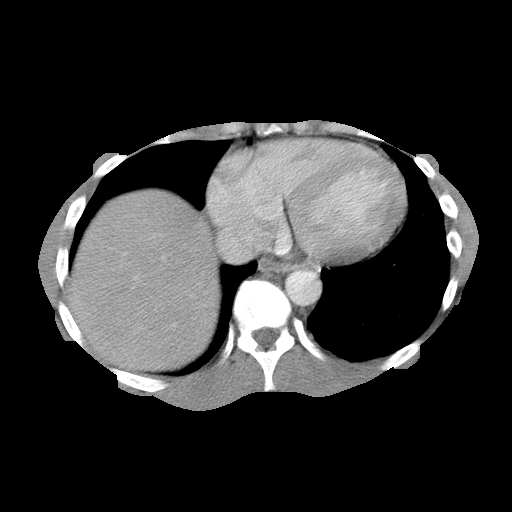
[im 54/57  soft-tissue]
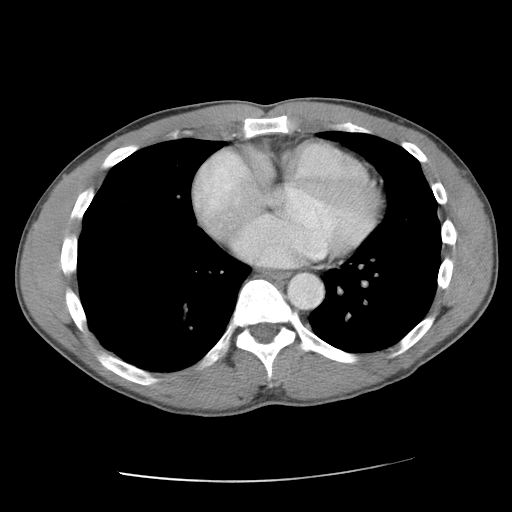

[Series 400: cor · coronal · 0.70mm/px · 3 of 102 slices shown]
[im 34/102  soft-tissue]
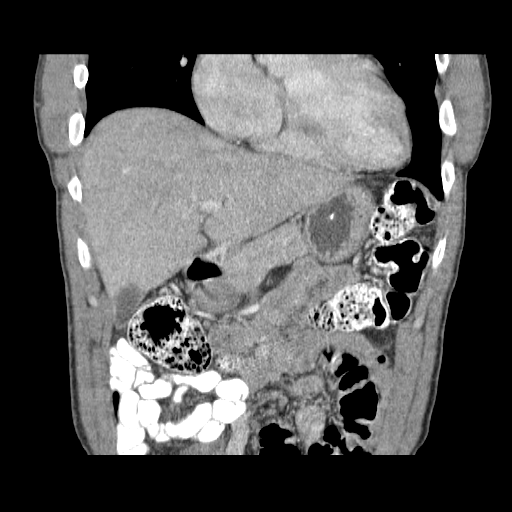
[im 45/102  soft-tissue]
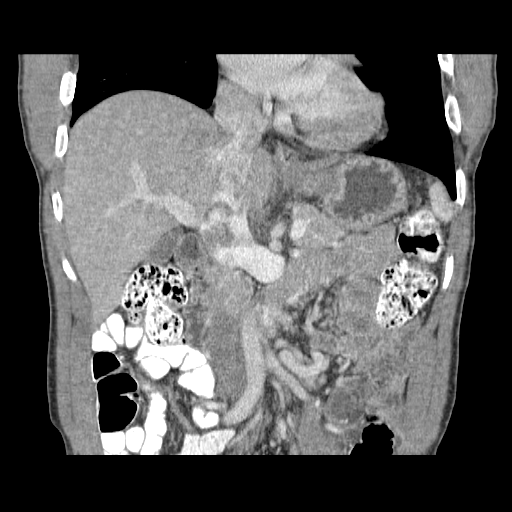
[im 57/102  soft-tissue]
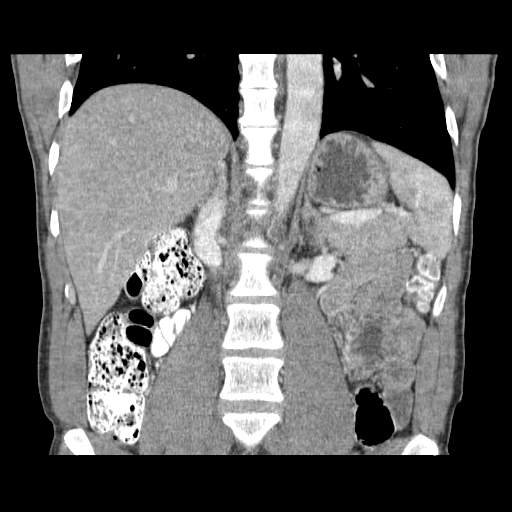

[17 of 46 positions shown; findings below may reference images not displayed]

FINDINGS: Lung bases are clear.  No pericardial fluid.

No focal hepatic lesion.  The gallbladder, pancreas, spleen,
adrenal glands, and kidneys are normal.

The stomach and limited view of the small bowel and colon are
normal.  The appendix is incompletely imaged but appears normal.

Abdominal aorta normal caliber.  No retroperitoneal periportal
lymphadenopathy. Review of  bone windows demonstrates no aggressive
osseous lesions.
IMPRESSION: 1..  No acute findings in the abdomen.
2.  No bowel abnormality in limited assessment of the small bowel
and colon.
3.  The appendix is partially visualized and appears normal.

## 2013-09-20 ENCOUNTER — Encounter (INDEPENDENT_AMBULATORY_CARE_PROVIDER_SITE_OTHER): Payer: BC Managed Care – PPO | Admitting: General Surgery

## 2013-09-29 ENCOUNTER — Encounter (INDEPENDENT_AMBULATORY_CARE_PROVIDER_SITE_OTHER): Payer: Self-pay | Admitting: General Surgery

## 2015-06-03 NOTE — Progress Notes (Signed)
     HPI:  44 yo male for evaluation of pericarditis. The patient does have a history of pericarditis. His last echocardiogram was in April 2013 and showed normal LV function and mild left atrial enlargement. He has not been seen in this office since March 2013.  Current Outpatient Prescriptions  Medication Sig Dispense Refill  . acetaminophen (TYLENOL) 500 MG tablet Take 500 mg by mouth every 6 (six) hours as needed. Pain    . esomeprazole (NEXIUM) 40 MG capsule Take 40 mg by mouth daily before breakfast. 1 tab po twice a week    . ibuprofen (ADVIL,MOTRIN) 800 MG tablet Take 1 tablet (800 mg total) by mouth 3 (three) times daily. 21 tablet 0  . methocarbamol (ROBAXIN) 500 MG tablet Take 1 tablet (500 mg total) by mouth 2 (two) times daily. 20 tablet 0   No current facility-administered medications for this visit.    Allergies  Allergen Reactions  . Isoniazid     REACTION: myalgias    Past Medical History  Diagnosis Date  . MIGRAINE HEADACHE   . PERICARDITIS, ACUTE   . GERD   . LYMPHADENOPATHY   . POSITIVE PPD     Past Surgical History  Procedure Laterality Date  . Hand fracture repair      History   Social History  . Marital Status: Single    Spouse Name: N/A  . Number of Children: N/A  . Years of Education: N/A   Occupational History  . Not on file.   Social History Main Topics  . Smoking status: Never Smoker   . Smokeless tobacco: Not on file  . Alcohol Use: Not on file  . Drug Use: Not on file  . Sexual Activity: Not on file   Other Topics Concern  . Not on file   Social History Narrative  . No narrative on file    No family history on file.  ROS: no fevers or chills, productive cough, hemoptysis, dysphasia, odynophagia, melena, hematochezia, dysuria, hematuria, rash, seizure activity, orthopnea, PND, pedal edema, claudication. Remaining systems are negative.  Physical Exam:   There were no vitals taken for this visit.  General:  Well  developed/well nourished in NAD Skin warm/dry Patient not depressed No peripheral clubbing Back-normal HEENT-normal/normal eyelids Neck supple/normal carotid upstroke bilaterally; no bruits; no JVD; no thyromegaly chest - CTA/ normal expansion CV - RRR/normal S1 and S2; no murmurs, rubs or gallops;  PMI nondisplaced Abdomen -NT/ND, no HSM, no mass, + bowel sounds, no bruit 2+ femoral pulses, no bruits Ext-no edema, chords, 2+ DP Neuro-grossly nonfocal  ECG    This encounter was created in error - please disregard.

## 2015-06-07 ENCOUNTER — Encounter: Payer: Self-pay | Admitting: Cardiology

## 2015-08-15 NOTE — Progress Notes (Signed)
     HPI: 44 year old male for evaluation of chest pain. He has been seen in this office previously but not since March 2013. He has a history of pericarditis. His last echocardiogram was performed in April 2013. LV function was normal and there was mild left atrial enlargement.  Current Outpatient Prescriptions  Medication Sig Dispense Refill  . acetaminophen (TYLENOL) 500 MG tablet Take 500 mg by mouth every 6 (six) hours as needed. Pain    . esomeprazole (NEXIUM) 40 MG capsule Take 40 mg by mouth daily before breakfast. 1 tab po twice a week    . ibuprofen (ADVIL,MOTRIN) 800 MG tablet Take 1 tablet (800 mg total) by mouth 3 (three) times daily. 21 tablet 0  . methocarbamol (ROBAXIN) 500 MG tablet Take 1 tablet (500 mg total) by mouth 2 (two) times daily. 20 tablet 0   No current facility-administered medications for this visit.    Allergies  Allergen Reactions  . Isoniazid     REACTION: myalgias    Past Medical History  Diagnosis Date  . MIGRAINE HEADACHE   . PERICARDITIS, ACUTE   . GERD   . LYMPHADENOPATHY   . POSITIVE PPD     Past Surgical History  Procedure Laterality Date  . Hand fracture repair      Social History   Social History  . Marital Status: Single    Spouse Name: N/A  . Number of Children: N/A  . Years of Education: N/A   Occupational History  . Not on file.   Social History Main Topics  . Smoking status: Never Smoker   . Smokeless tobacco: Not on file  . Alcohol Use: Not on file  . Drug Use: Not on file  . Sexual Activity: Not on file   Other Topics Concern  . Not on file   Social History Narrative  . No narrative on file    No family history on file.  ROS: no fevers or chills, productive cough, hemoptysis, dysphasia, odynophagia, melena, hematochezia, dysuria, hematuria, rash, seizure activity, orthopnea, PND, pedal edema, claudication. Remaining systems are negative.  Physical Exam:   There were no vitals taken for this  visit.  General:  Well developed/well nourished in NAD Skin warm/dry Patient not depressed No peripheral clubbing Back-normal HEENT-normal/normal eyelids Neck supple/normal carotid upstroke bilaterally; no bruits; no JVD; no thyromegaly chest - CTA/ normal expansion CV - RRR/normal S1 and S2; no murmurs, rubs or gallops;  PMI nondisplaced Abdomen -NT/ND, no HSM, no mass, + bowel sounds, no bruit 2+ femoral pulses, no bruits Ext-no edema, chords, 2+ DP Neuro-grossly nonfocal  ECG    This encounter was created in error - please disregard.

## 2015-08-16 ENCOUNTER — Encounter: Payer: Self-pay | Admitting: Cardiology

## 2015-09-16 NOTE — Progress Notes (Signed)
     HPI: 44 year old male for evaluation of chest pain. Patient seen in this office previously but not since March 2013. He has a history of pericarditis. His last echocardiogram was performed in April 2013. There was normal LV function and mild left atrial enlargement. No pericardial effusion.   Current Outpatient Prescriptions  Medication Sig Dispense Refill  . acetaminophen (TYLENOL) 500 MG tablet Take 500 mg by mouth every 6 (six) hours as needed. Pain    . esomeprazole (NEXIUM) 40 MG capsule Take 40 mg by mouth daily before breakfast. 1 tab po twice a week    . ibuprofen (ADVIL,MOTRIN) 800 MG tablet Take 1 tablet (800 mg total) by mouth 3 (three) times daily. 21 tablet 0  . methocarbamol (ROBAXIN) 500 MG tablet Take 1 tablet (500 mg total) by mouth 2 (two) times daily. 20 tablet 0   No current facility-administered medications for this visit.    Allergies  Allergen Reactions  . Isoniazid     REACTION: myalgias    Past Medical History  Diagnosis Date  . MIGRAINE HEADACHE   . PERICARDITIS, ACUTE   . GERD   . LYMPHADENOPATHY   . POSITIVE PPD     Past Surgical History  Procedure Laterality Date  . Hand fracture repair      Social History   Social History  . Marital Status: Single    Spouse Name: N/A  . Number of Children: N/A  . Years of Education: N/A   Occupational History  . Not on file.   Social History Main Topics  . Smoking status: Never Smoker   . Smokeless tobacco: Not on file  . Alcohol Use: Not on file  . Drug Use: Not on file  . Sexual Activity: Not on file   Other Topics Concern  . Not on file   Social History Narrative  . No narrative on file    No family history on file.  ROS: no fevers or chills, productive cough, hemoptysis, dysphasia, odynophagia, melena, hematochezia, dysuria, hematuria, rash, seizure activity, orthopnea, PND, pedal edema, claudication. Remaining systems are negative.  Physical Exam:   There were no vitals taken  for this visit.  General:  Well developed/well nourished in NAD Skin warm/dry Patient not depressed No peripheral clubbing Back-normal HEENT-normal/normal eyelids Neck supple/normal carotid upstroke bilaterally; no bruits; no JVD; no thyromegaly chest - CTA/ normal expansion CV - RRR/normal S1 and S2; no murmurs, rubs or gallops;  PMI nondisplaced Abdomen -NT/ND, no HSM, no mass, + bowel sounds, no bruit 2+ femoral pulses, no bruits Ext-no edema, chords, 2+ DP Neuro-grossly nonfocal  ECG    This encounter was created in error - please disregard.

## 2015-09-17 ENCOUNTER — Encounter: Payer: Self-pay | Admitting: Cardiology

## 2015-10-09 ENCOUNTER — Ambulatory Visit: Payer: Self-pay | Admitting: General Surgery

## 2015-10-09 NOTE — H&P (Signed)
[name] [name] 10/09/2015 10:08 AM [facility] Patient #: [id number] DOB: [birth year] Single / Language: [place] / Race: Black or African American Male   History of Present Illness [name] [name]([name] [name] J. [name] Kargbo MD; 10/09/2015 10:59 AM) The patient is a 44 year old male.  Note:He is referred by Dr. Allyne GeeSanders because of a symptomatic left inguinal hernia. He had open repair of right inguinal hernia with mesh approximately 7 years ago. He is noted to an enlarging left inguinal bulge which sometimes causes discomfort with bowel movements and has to be manually reduced. He is also noted that he is having a bowel movement every other day since the hernia has enlarged versus daily like he used to. No blood in the stool or decreased caliber. No difficulty with urination. He also has a small umbilical hernia that is intermittently symptomatic. He does a lot of heavy lifting at work.  Other Problems [name] [name]([name] [name], CMA; 10/09/2015 10:08 AM) Gastric Ulcer Gastroesophageal Reflux Disease Other disease, cancer, significant illness  Diagnostic Studies History [name] [name]([name] [name], CMA; 10/09/2015 10:08 AM) Colonoscopy never  Allergies [name] [name]([name] [name], CMA; 10/09/2015 10:09 AM) Isoniazid *Antimycobacterial Agents**  Medication History [name] [name]([name] [name], CMA; 10/09/2015 10:09 AM) Tylenol (500MG  Capsule, Oral asw.) Active. Medications Reconciled  Social History [name] [name]([name] [name], CMA; 10/09/2015 10:08 AM) Alcohol use Occasional alcohol use. Caffeine use Carbonated beverages, Tea. No drug use Tobacco use Never smoker.  Family History [name] [name]([name] [name], [place]; 10/09/2015 10:08 AM) Hypertension Mother.    Review of Systems [name] [name]([name] [name] CMA; 10/09/2015 10:08 AM) General Not Present- Appetite Loss, Chills, Fatigue, Fever, Night Sweats, Weight Gain and Weight Loss. Skin Not Present- Change in Wart/Mole, Dryness, Hives, Jaundice, New Lesions, Non-Healing Wounds, Rash and Ulcer. HEENT Present-  Seasonal Allergies. Not Present- Earache, Hearing Loss, Hoarseness, Nose Bleed, Oral Ulcers, Ringing in the Ears, Sinus Pain, Sore Throat, Visual Disturbances, Wears glasses/contact lenses and Yellow Eyes. Respiratory Present- Snoring. Not Present- Bloody sputum, Chronic Cough, Difficulty Breathing and Wheezing. Breast Not Present- Breast Mass, Breast Pain, Nipple Discharge and Skin Changes. Cardiovascular Not Present- Chest Pain, Difficulty Breathing Lying Down, Leg Cramps, Palpitations, Rapid Heart Rate, Shortness of Breath and Swelling of Extremities. Gastrointestinal Present- Change in Bowel Habits. Not Present- Abdominal Pain, Bloating, Bloody Stool, Chronic diarrhea, Constipation, Difficulty Swallowing, Excessive gas, Gets full quickly at meals, Hemorrhoids, Indigestion, Nausea, Rectal Pain and Vomiting. Male Genitourinary Not Present- Blood in Urine, Change in Urinary Stream, Frequency, Impotence, Nocturia, Painful Urination, Urgency and Urine Leakage. Musculoskeletal Not Present- Back Pain, Joint Pain, Joint Stiffness, Muscle Pain, Muscle Weakness and Swelling of Extremities. Neurological Not Present- Decreased Memory, Fainting, Headaches, Numbness, Seizures, Tingling, Tremor, Trouble walking and Weakness. Psychiatric Present- Change in Sleep Pattern. Not Present- Anxiety, Bipolar, Depression, Fearful and Frequent crying. Endocrine Not Present- Cold Intolerance, Excessive Hunger, Hair Changes, Heat Intolerance, Hot flashes and New Diabetes. Hematology Not Present- Easy Bruising, Excessive bleeding, Gland problems, HIV and Persistent Infections.  Vitals [name] [name]([name] [name] CMA; 10/09/2015 10:09 AM) 10/09/2015 10:09 AM Weight: 175.8 lb Height: 72in Body Surface Area: 2.02 m Body Mass Index: 23.84 kg/m  Temp.: 15F(Temporal)  Pulse: 64 (Regular)  BP: 130/70 (Sitting, Left Arm, Standard)       Physical Exam [name] [name]([name] Busta J. Minetta Krisher MD; 10/09/2015 11:01 AM) The physical exam findings  are as follows: Note:General: WDWN in NAD. Pleasant and cooperative.  HEENT: [place]/AT, no facial masses  CV: RRR, no murmur, no JVD.  ABDOMEN: Soft, nontender, nondistended, no masses, no organomegaly, active bowel sounds, no scars, small reducible umbilical bulge.  Fascial defect palpable.  ANORECTAL: No fissures. Normal sphincter tone. No masses.  GU: Right inguinal scar with medial reducible bulge. Left inguinal bulge that is reducible. No testicular masses.  MUSCULOSKELETAL: No edema  NEUROLOGIC: Alert and oriented, answers questions appropriately, normal gait and station.  PSYCHIATRIC: Normal mood, affect , and behavior.    Assessment & Plan [name] Pollack MD; 10/09/2015 10:58 AM) BILATERAL INGUINAL HERNIA WITHOUT OBSTRUCTION OR GANGRENE, RECURRENCE NOT SPECIFIED (K40.20) Impression: The right side is recurrent, the left side is new. Left side is symptomatic.  Plan: Laparoscopic bilateral inguinal hernia. Mesh. We'll also repair his symptomatic umbilical hernia with mesh at the same time. I have explained the procedure, risks, and aftercare of inguinal hernia repair. Risks include but are not limited to bleeding, infection, wound problems, anesthesia, recurrence, bladder or intestine injury, urinary retention, testicular dysfunction, chronic pain, mesh problems. He seems to understand and agrees to proceed. UMBILICAL HERNIA WITHOUT OBSTRUCTION AND WITHOUT GANGRENE (K42.9) Impression: Symptomatic at times. Plan repair as above.  Avel Peace, MD

## 2015-11-06 ENCOUNTER — Other Ambulatory Visit: Payer: Self-pay | Admitting: Pain Medicine

## 2015-11-06 ENCOUNTER — Ambulatory Visit
Admission: RE | Admit: 2015-11-06 | Discharge: 2015-11-06 | Disposition: A | Discharge status: Home / Self Care | Payer: BLUE CROSS/BLUE SHIELD | Source: Ambulatory Visit | Attending: Pain Medicine | Admitting: Pain Medicine

## 2015-11-06 DIAGNOSIS — A150 Tuberculosis of lung: Secondary | ICD-10-CM

## 2015-11-12 ENCOUNTER — Telehealth: Payer: Self-pay | Admitting: General Surgery

## 2015-11-12 NOTE — Telephone Encounter (Signed)
Patient called today stating he had a cough with greenish tinged sputum.  He is status post hernia surgery four days ago. I recommended that he call his primary care physician to be evaluated further.

## 2015-11-13 ENCOUNTER — Emergency Department (HOSPITAL_COMMUNITY)
Admission: EM | Admit: 2015-11-13 | Discharge: 2015-11-13 | Disposition: A | Discharge status: Home / Self Care | Payer: BLUE CROSS/BLUE SHIELD | Attending: Emergency Medicine | Admitting: Emergency Medicine

## 2015-11-13 ENCOUNTER — Encounter (HOSPITAL_COMMUNITY): Payer: Self-pay | Admitting: Emergency Medicine

## 2015-11-13 ENCOUNTER — Emergency Department (HOSPITAL_COMMUNITY): Payer: BLUE CROSS/BLUE SHIELD

## 2015-11-13 DIAGNOSIS — K219 Gastro-esophageal reflux disease without esophagitis: Secondary | ICD-10-CM | POA: Diagnosis not present

## 2015-11-13 DIAGNOSIS — Z791 Long term (current) use of non-steroidal anti-inflammatories (NSAID): Secondary | ICD-10-CM | POA: Insufficient documentation

## 2015-11-13 DIAGNOSIS — Z79899 Other long term (current) drug therapy: Secondary | ICD-10-CM | POA: Diagnosis not present

## 2015-11-13 DIAGNOSIS — R05 Cough: Secondary | ICD-10-CM | POA: Diagnosis present

## 2015-11-13 DIAGNOSIS — J40 Bronchitis, not specified as acute or chronic: Secondary | ICD-10-CM | POA: Diagnosis not present

## 2015-11-13 DIAGNOSIS — R509 Fever, unspecified: Secondary | ICD-10-CM | POA: Insufficient documentation

## 2015-11-13 DIAGNOSIS — Z8679 Personal history of other diseases of the circulatory system: Secondary | ICD-10-CM | POA: Insufficient documentation

## 2015-11-13 MED ORDER — LEVOFLOXACIN 500 MG PO TABS: 500.0000 mg | ORAL_TABLET | Freq: Every day | ORAL | Status: DC

## 2015-11-13 MED ORDER — LEVOFLOXACIN 500 MG PO TABS
500.0000 mg | ORAL_TABLET | Freq: Once | ORAL | Status: AC
  Administered 2015-11-13: 500 mg via ORAL
  Filled 2015-11-13: qty 1

## 2015-11-13 MED ORDER — ALBUTEROL SULFATE HFA 108 (90 BASE) MCG/ACT IN AERS
2.0000 | INHALATION_SPRAY | RESPIRATORY_TRACT | Status: DC
  Administered 2015-11-13: 2 via RESPIRATORY_TRACT
  Filled 2015-11-13: qty 6.7

## 2015-11-13 NOTE — ED Notes (Signed)
Patient had triple hernia repair on 12/23.  Staples remain in lower abd.

## 2015-11-13 NOTE — ED Provider Notes (Signed)
CSN: [id number]     Arrival date & time 11/13/15  0021 History   First MD Initiated Contact with Patient 11/13/15 [phone]     Chief Complaint  Patient presents with  . Cough      HPI Patient underwent umbilical and bilateral inguinal hernia repair on 11/08/2015.  He now presents with productive cough and fever over the past 24-48 hours.  He reports chest congestion.  Mild sore throat.  Denies increasing abdominal pain.  Denies drainage around his incision sites.  Reports no nausea or vomiting.  Symptoms are moderate in severity.  He's concerned about the possibility of lung infection.  His appetite remains stable   Past Medical History  Diagnosis Date  . MIGRAINE HEADACHE   . PERICARDITIS, ACUTE   . GERD   . LYMPHADENOPATHY   . POSITIVE PPD    Past Surgical History  Procedure Laterality Date  . Hand fracture repair    . Hernia repair     No family history on file. Social History  Substance Use Topics  . Smoking status: Never Smoker   . Smokeless tobacco: None  . Alcohol Use: Yes    Review of Systems  All other systems reviewed and are negative.     Allergies  Isoniazid  Home Medications   Prior to Admission medications   Medication Sig Start Date End Date Taking? Authorizing Provider  acetaminophen (TYLENOL) 500 MG tablet Take 500 mg by mouth every 6 (six) hours as needed. Pain    Historical Provider, MD  esomeprazole (NEXIUM) 40 MG capsule Take 40 mg by mouth daily before breakfast. 1 tab po twice a week    Historical Provider, MD  ibuprofen (ADVIL,MOTRIN) 800 MG tablet Take 1 tablet (800 mg total) by mouth 3 (three) times daily. 12/06/12   Fayrene Helper, PA-C  levofloxacin (LEVAQUIN) 500 MG tablet Take 1 tablet (500 mg total) by mouth daily. 11/13/15   Azalia Bilis, MD  methocarbamol (ROBAXIN) 500 MG tablet Take 1 tablet (500 mg total) by mouth 2 (two) times daily. 12/06/12   Fayrene Helper, PA-C   BP 148/101 mmHg  Pulse 79  Temp(Src) 99.9 F (37.7 C) (Oral)  Resp 16   Ht 6' (1.829 m)  Wt 171 lb (77.565 kg)  BMI 23.19 kg/m2  SpO2 96% Physical Exam  Constitutional: He is oriented to person, place, and time. He appears well-developed and well-nourished.  HENT:  Head: Normocephalic and atraumatic.  Eyes: EOM are normal.  Neck: Normal range of motion.  Cardiovascular: Normal rate, regular rhythm, normal heart sounds and intact distal pulses.   Pulmonary/Chest: Effort normal and breath sounds normal. No respiratory distress.  Abdominal: Soft. He exhibits no distension. There is no tenderness.  2 midline periumbilical surgical incisions without surrounding signs of infection.  No drainage.  No spreading erythema.  Musculoskeletal: Normal range of motion.  Neurological: He is alert and oriented to person, place, and time.  Skin: Skin is warm and dry.  Psychiatric: He has a normal mood and affect. Judgment normal.  Nursing note and vitals reviewed.   ED Course  Procedures (including critical care time) Labs Review Labs Reviewed - No data to display  Imaging Review Dg Chest 2 View  11/13/2015  CLINICAL DATA:  Acute onset of cough, congestion and fever. Initial encounter. EXAM: CHEST  2 VIEW COMPARISON:  Chest radiograph performed 11/06/2015 FINDINGS: The lungs are well-aerated. Minimal left basilar scarring is noted. There is no evidence of focal opacification, pleural effusion or pneumothorax. The  heart is normal in size; the mediastinal contour is within normal limits. No acute osseous abnormalities are seen. IMPRESSION: No acute cardiopulmonary process seen. Electronically Signed   By: Roanna RaiderJeffery  Chang M.D.   On: 11/13/2015 01:27   I have personally reviewed and evaluated these images and lab results as part of my medical decision-making.   EKG Interpretation None      MDM   Final diagnoses:  Bronchitis    Bronchitis with possible early developing pneumonia given fever to 101 last night as well as change in his sputum color.  Patient be started  on Levaquin.  He understands to follow-up with his primary care physician and return to the ER for new or worsening symptoms.  Surgical incisions of his abdomen are without secondary signs of infection.  Well appearing.  Nontoxic.  Vitals normal.    Azalia BilisKevin Cameron Katayama, MD 11/13/15 [phone]

## 2015-11-13 NOTE — ED Notes (Signed)
Pt. reports productive cough with chest congestion and fever onset 2 days ago , [facility]

## 2016-09-28 DIAGNOSIS — Z6826 Body mass index (BMI) 26.0-26.9, adult: Secondary | ICD-10-CM | POA: Diagnosis not present

## 2016-09-28 DIAGNOSIS — E559 Vitamin D deficiency, unspecified: Secondary | ICD-10-CM | POA: Diagnosis not present

## 2016-09-28 DIAGNOSIS — Z0000 Encounter for general adult medical examination without abnormal findings: Secondary | ICD-10-CM | POA: Diagnosis not present

## 2017-01-26 IMAGING — CR DG CHEST 2V
2 series · 2 of 2 positions shown · non-contrast
Comparison: Chest radiograph performed 11/06/2015

CLINICAL DATA: Acute onset of cough, congestion and fever. Initial
encounter.

EXAM:
CHEST  2 VIEW

[chest pa]
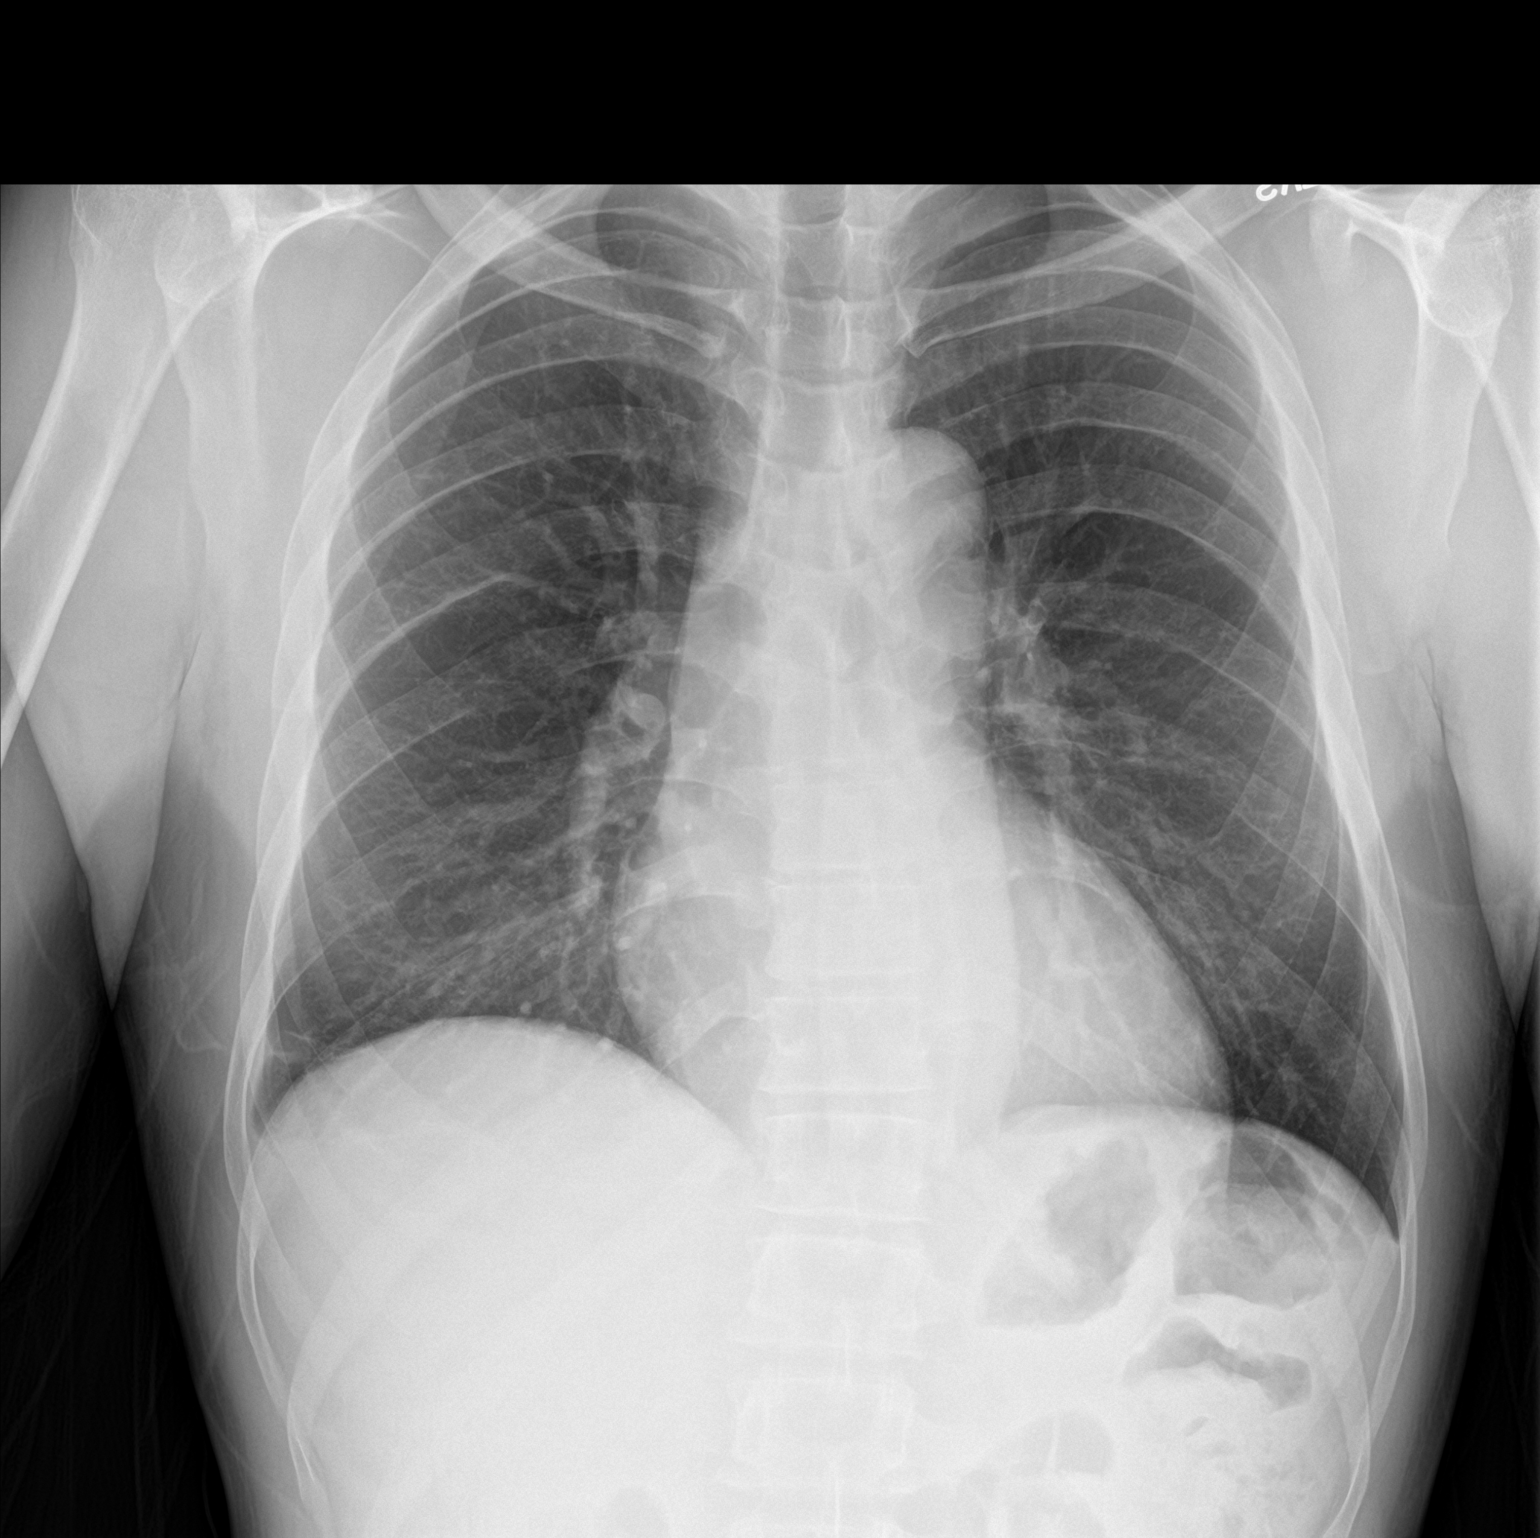

[chest lat]
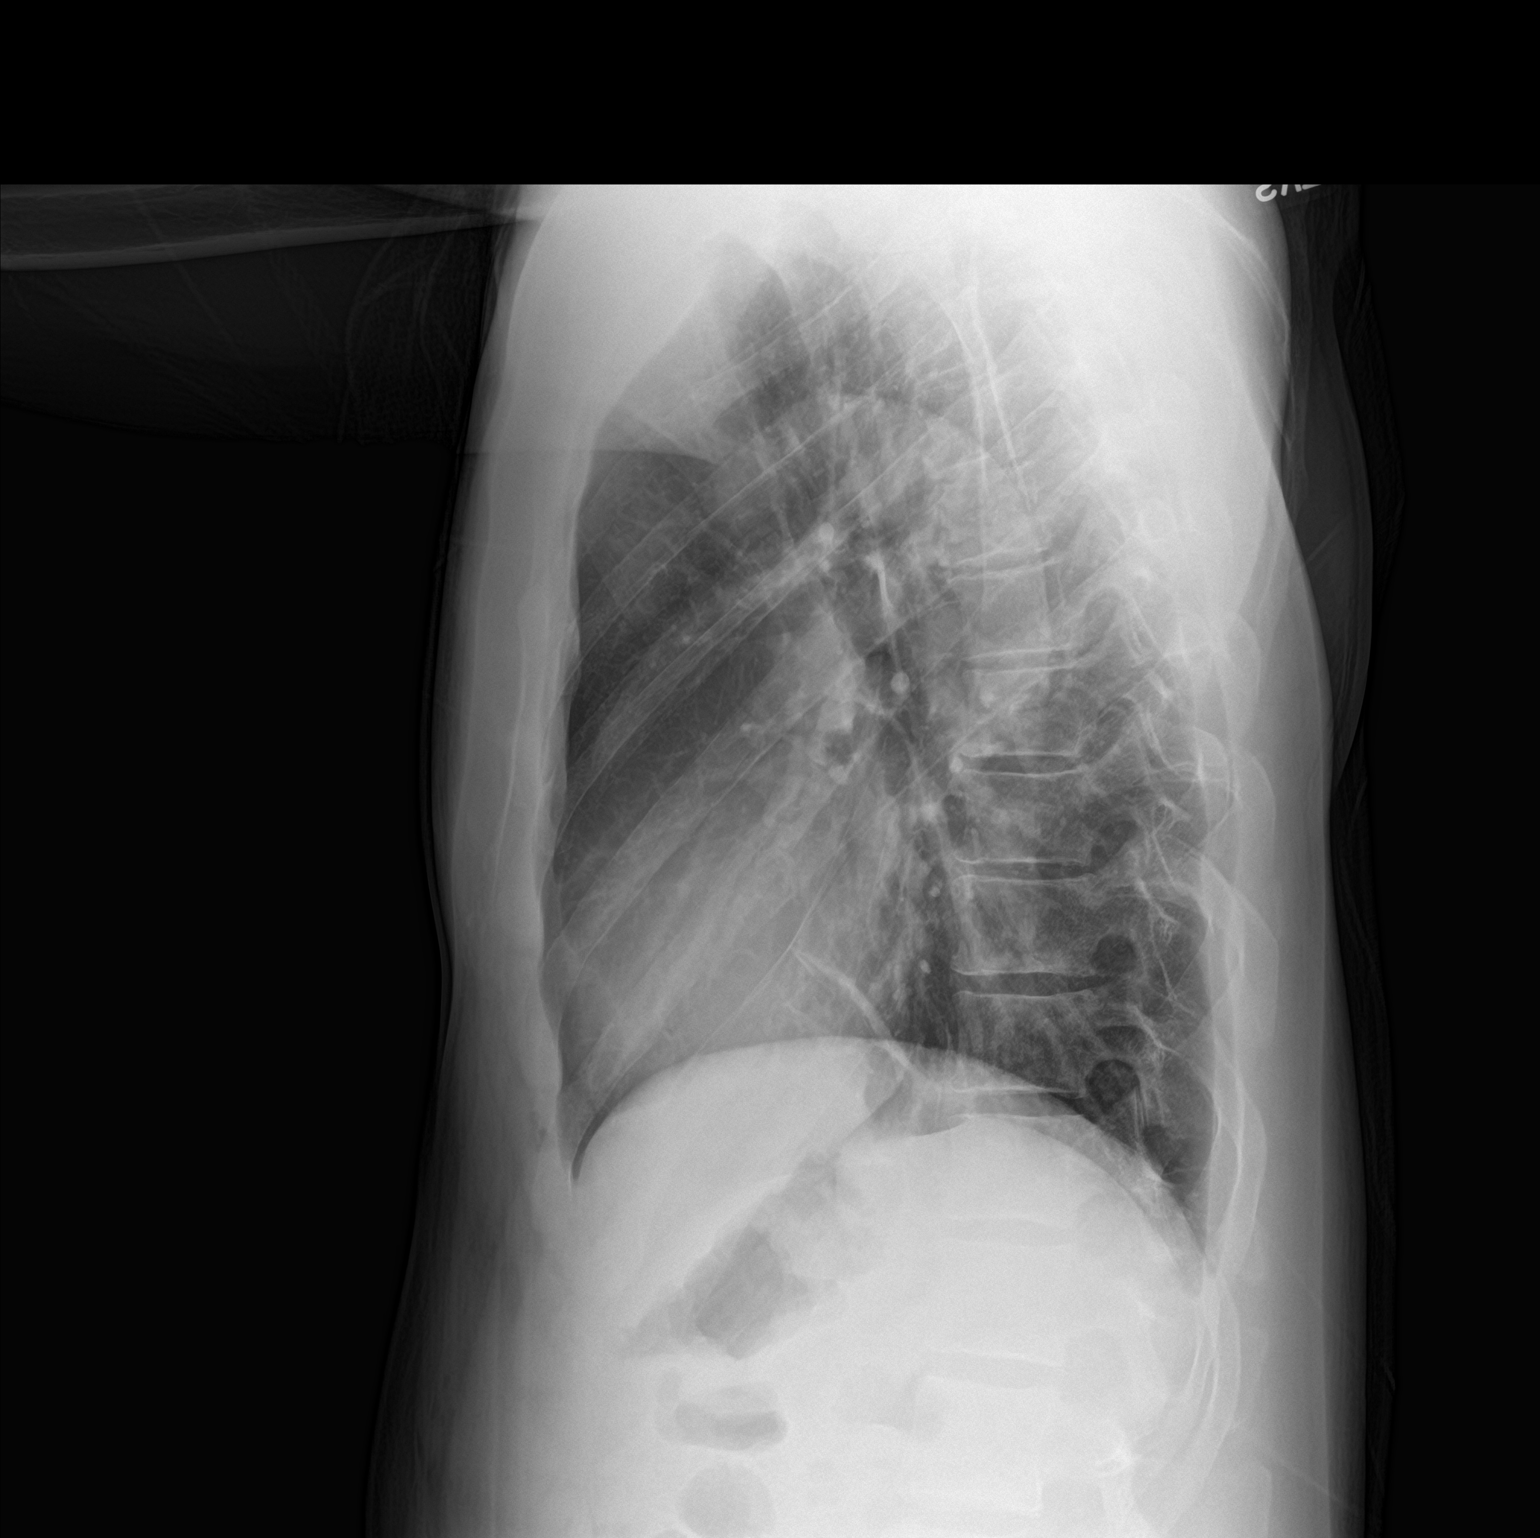

[2 of 2 positions shown; findings below may reference images not displayed]

FINDINGS: The lungs are well-aerated. Minimal left basilar scarring is noted.
There is no evidence of focal opacification, pleural effusion or
pneumothorax.

The heart is normal in size; the mediastinal contour is within
normal limits. No acute osseous abnormalities are seen.
IMPRESSION: No acute cardiopulmonary process seen.

## 2017-05-11 ENCOUNTER — Ambulatory Visit
Admission: EM | Admit: 2017-05-11 | Discharge: 2017-05-11 | Disposition: A | Discharge status: Home / Self Care | Payer: BLUE CROSS/BLUE SHIELD | Attending: Emergency Medicine | Admitting: Emergency Medicine

## 2017-05-11 DIAGNOSIS — K0510 Chronic gingivitis, plaque induced: Secondary | ICD-10-CM | POA: Diagnosis not present

## 2017-05-11 MED ORDER — MAGIC MOUTHWASH W/LIDOCAINE
5.0000 mL | Freq: Four times a day (QID) | ORAL | 0 refills | Status: DC | PRN
Start: 2017-05-11 — End: 2018-05-31

## 2017-05-11 MED ORDER — IBUPROFEN 800 MG PO TABS: 800.0000 mg | ORAL_TABLET | Freq: Three times a day (TID) | ORAL | 0 refills | Status: DC | PRN

## 2017-05-11 MED ORDER — PENICILLIN V POTASSIUM 500 MG PO TABS
500.0000 mg | ORAL_TABLET | Freq: Four times a day (QID) | ORAL | 0 refills | Status: DC
Start: 2017-05-11 — End: 2018-05-31

## 2017-05-11 NOTE — Discharge Instructions (Signed)
Take medication as prescribed. Rest. Drink plenty of fluids. Soft foods. Rinse mouth frequently.   Follow up with your primary care physician or Dentist this week as needed. Return to Urgent care for new or worsening concerns.
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
## 2017-05-13 LAB — HSV CULTURE AND TYPING

## 2017-12-24 DIAGNOSIS — K219 Gastro-esophageal reflux disease without esophagitis: Secondary | ICD-10-CM | POA: Diagnosis not present

## 2017-12-24 DIAGNOSIS — E559 Vitamin D deficiency, unspecified: Secondary | ICD-10-CM | POA: Diagnosis not present

## 2017-12-24 DIAGNOSIS — Z125 Encounter for screening for malignant neoplasm of prostate: Secondary | ICD-10-CM | POA: Diagnosis not present

## 2017-12-24 DIAGNOSIS — Z0000 Encounter for general adult medical examination without abnormal findings: Secondary | ICD-10-CM | POA: Diagnosis not present

## 2018-02-04 DIAGNOSIS — K219 Gastro-esophageal reflux disease without esophagitis: Secondary | ICD-10-CM | POA: Diagnosis not present

## 2018-02-04 DIAGNOSIS — R001 Bradycardia, unspecified: Secondary | ICD-10-CM | POA: Diagnosis not present

## 2018-02-04 DIAGNOSIS — I319 Disease of pericardium, unspecified: Secondary | ICD-10-CM | POA: Diagnosis not present

## 2018-02-23 ENCOUNTER — Other Ambulatory Visit: Payer: Self-pay

## 2018-02-23 ENCOUNTER — Encounter: Payer: Self-pay | Admitting: Emergency Medicine

## 2018-02-23 ENCOUNTER — Ambulatory Visit
Admission: EM | Admit: 2018-02-23 | Discharge: 2018-02-23 | Disposition: A | Discharge status: Home / Self Care | Payer: BLUE CROSS/BLUE SHIELD | Attending: Family Medicine | Admitting: Family Medicine

## 2018-02-23 DIAGNOSIS — S29019A Strain of muscle and tendon of unspecified wall of thorax, initial encounter: Secondary | ICD-10-CM

## 2018-02-23 DIAGNOSIS — M542 Cervicalgia: Secondary | ICD-10-CM

## 2018-02-23 MED ORDER — CYCLOBENZAPRINE HCL 10 MG PO TABS: 10.0000 mg | ORAL_TABLET | Freq: Three times a day (TID) | ORAL | 0 refills | Status: DC | PRN

## 2018-02-23 NOTE — ED Provider Notes (Signed)
[facility]    CSN: [id number] Arrival date & time: 02/23/18  1719     History   Chief Complaint Chief Complaint  Patient presents with  . Shoulder Pain    left  . Neck Pain    HPI [name] [name] is a 47 y.o. male.   47 yo male with a c/o left shoulder and neck pain since this morning. Patient denies any injuries, falls, rash, fevers, numbness/tingling.   The history is provided by the patient.  Shoulder Pain  Associated symptoms: neck pain   Neck Pain    Past Medical History:  Diagnosis Date  . GERD   . LYMPHADENOPATHY   . MIGRAINE HEADACHE   . PERICARDITIS, ACUTE   . POSITIVE PPD     Patient Active Problem List   Diagnosis Date Noted  . DYSPNEA 02/18/2010  . MITRAL REGURGITATION 02/21/2009  . CHEST PAIN 02/21/2009  . MIGRAINE HEADACHE 11/11/2006  . CONJUNCTIVITIS, ALLERGIC 11/11/2006  . PERICARDITIS, ACUTE 11/11/2006  . GERD 11/11/2006  . LYMPHADENOPATHY 11/11/2006  . POSITIVE PPD 11/11/2006    Past Surgical History:  Procedure Laterality Date  . hand fracture repair    . HERNIA REPAIR         Home Medications    Prior to Admission medications   Medication Sig Start Date End Date Taking? Authorizing Provider  acetaminophen (TYLENOL) 500 MG tablet Take 500 mg by mouth every 6 (six) hours as needed. Pain   Yes [provider]  ibuprofen (ADVIL,MOTRIN) 800 MG tablet Take 1 tablet (800 mg total) by mouth 3 (three) times daily. 12/06/12  Yes Fayrene Helper, PA-C  ibuprofen (ADVIL,MOTRIN) 800 MG tablet Take 1 tablet (800 mg total) by mouth every 8 (eight) hours as needed for mild pain or moderate pain. 05/11/17  Yes Renford Dills, NP  Multiple Vitamins-Minerals (MULTIVITAMIN WITH MINERALS) tablet Take 1 tablet by mouth daily.   Yes [provider]  cyclobenzaprine (FLEXERIL) 10 MG tablet Take 1 tablet (10 mg total) by mouth 3 (three) times daily as needed for muscle spasms. 02/23/18   Payton Mccallum, MD  esomeprazole  (NEXIUM) 40 MG capsule Take 40 mg by mouth daily before breakfast. 1 tab po twice a week    [provider]  levofloxacin (LEVAQUIN) 500 MG tablet Take 1 tablet (500 mg total) by mouth daily. 11/13/15   Azalia Bilis, MD  magic mouthwash w/lidocaine SOLN Take 5 mLs by mouth 4 (four) times daily as needed for mouth pain. 05/11/17   Renford Dills, NP  methocarbamol (ROBAXIN) 500 MG tablet Take 1 tablet (500 mg total) by mouth 2 (two) times daily. 12/06/12   Fayrene Helper, PA-C  penicillin v potassium (VEETID) 500 MG tablet Take 1 tablet (500 mg total) by mouth 4 (four) times daily. 05/11/17   Renford Dills, NP    Family History Family History  Problem Relation Age of Onset  . Hypertension Mother   . Kidney disease Father     Social History Social History   Tobacco Use  . Smoking status: Never Smoker  . Smokeless tobacco: Never Used  Substance Use Topics  . Alcohol use: Yes    Comment: rarely  . Drug use: No     Allergies   Patient has no known allergies.   Review of Systems Review of Systems  Musculoskeletal: Positive for neck pain.     Physical Exam Triage Vital Signs ED Triage Vitals  Enc Vitals Group     BP 02/23/18 1733 Marland Kitchen)  140/109     Pulse Rate 02/23/18 1733 (!) 59     Resp 02/23/18 1733 16     Temp 02/23/18 1733 97.8 F (36.6 C)     Temp Source 02/23/18 1733 Oral     SpO2 02/23/18 1733 98 %     Weight 02/23/18 1733 180 lb (81.6 kg)     Height 02/23/18 1733 5\' 11"  (1.803 m)     Head Circumference --      Peak Flow --      Pain Score 02/23/18 1732 8     Pain Loc --      Pain Edu? --      Excl. in GC? --    No data found.  Updated Vital Signs BP (!) 132/106 (BP Location: Left Arm)   Pulse (!) 59   Temp 97.8 F (36.6 C) (Oral)   Resp 16   Ht 5\' 11"  (1.803 m)   Wt 180 lb (81.6 kg)   SpO2 98%   BMI 25.10 kg/m   Visual Acuity Right Eye Distance:   Left Eye Distance:   Bilateral Distance:    Right Eye Near:   Left Eye Near:      Bilateral Near:     Physical Exam  Constitutional: He appears well-developed and well-nourished. No distress.  Musculoskeletal:       Cervical back: He exhibits tenderness (over the left trapezius) and spasm. He exhibits normal range of motion, no bony tenderness, no swelling, no edema, no deformity, no laceration and normal pulse.  Skin: He is not diaphoretic.  Nursing note and vitals reviewed.    UC Treatments / Results  Labs (all labs ordered are listed, but only abnormal results are displayed) Labs Reviewed - No data to display  EKG None Radiology No results found.  Procedures Procedures (including critical care time)  Medications Ordered in UC Medications - No data to display   Initial Impression / Assessment and Plan / UC Course  I have reviewed the triage vital signs and the nursing notes.  Pertinent labs & imaging results that were available during my care of the patient were reviewed by me and considered in my medical decision making (see chart for details).       Final Clinical Impressions(s) / UC Diagnoses   Final diagnoses:  Thoracic myofascial strain, initial encounter  Neck pain    ED Discharge Orders        Ordered    cyclobenzaprine (FLEXERIL) 10 MG tablet  3 times daily PRN     02/23/18 1806     1. diagnosis reviewed with patient 2. rx as per orders above; reviewed possible side effects, interactions, risks and benefits  3. Recommend supportive treatment with otc analgesics, heat, stretch 4. Follow-up prn if symptoms worsen or don't improve  Controlled Substance Prescriptions [place] Controlled Substance Registry consulted? Not Applicable   Payton Mccallumonty, Soloman Mckeithan, MD 02/23/18 [phone]

## 2018-02-23 NOTE — ED Triage Notes (Signed)
Patient in today c/o left shoulder and neck pain since this morning. Patient denies any injury.

## 2018-02-23 NOTE — Discharge Instructions (Signed)
Ibuprofen 600mg three times daily

## 2018-04-04 ENCOUNTER — Ambulatory Visit: Payer: BLUE CROSS/BLUE SHIELD | Admitting: Adult Health

## 2018-04-04 NOTE — Progress Notes (Deleted)
Cardiology Office Note   Date:  04/04/2018   ID:  [name] [name], DOB 1971-10-18, MRN [id number]  PCP:  Dorothyann Peng, MD  Cardiologist: Dr. Jens Som No chief complaint on file.    History of Present Illness: [name] [name] is a 47 y.o. male who presents for ongoing assessment and management of pericarditis, most recent echocardiogram revealed normal LV systolic function with mild biatrial enlargement.  He was last seen by Dr. Jens Som in March 2013.  He was to return as needed.  Dr. [place]    Past Medical History:  Diagnosis Date  . GERD   . LYMPHADENOPATHY   . MIGRAINE HEADACHE   . PERICARDITIS, ACUTE   . POSITIVE PPD     Past Surgical History:  Procedure Laterality Date  . hand fracture repair    . HERNIA REPAIR       Current Outpatient Medications  Medication Sig Dispense Refill  . acetaminophen (TYLENOL) 500 MG tablet Take 500 mg by mouth every 6 (six) hours as needed. Pain    . cyclobenzaprine (FLEXERIL) 10 MG tablet Take 1 tablet (10 mg total) by mouth 3 (three) times daily as needed for muscle spasms. 30 tablet 0  . esomeprazole (NEXIUM) 40 MG capsule Take 40 mg by mouth daily before breakfast. 1 tab po twice a week    . ibuprofen (ADVIL,MOTRIN) 800 MG tablet Take 1 tablet (800 mg total) by mouth 3 (three) times daily. 21 tablet 0  . ibuprofen (ADVIL,MOTRIN) 800 MG tablet Take 1 tablet (800 mg total) by mouth every 8 (eight) hours as needed for mild pain or moderate pain. 15 tablet 0  . levofloxacin (LEVAQUIN) 500 MG tablet Take 1 tablet (500 mg total) by mouth daily. 6 tablet 0  . magic mouthwash w/lidocaine SOLN Take 5 mLs by mouth 4 (four) times daily as needed for mouth pain. 150 mL 0  . methocarbamol (ROBAXIN) 500 MG tablet Take 1 tablet (500 mg total) by mouth 2 (two) times daily. 20 tablet 0  . Multiple Vitamins-Minerals (MULTIVITAMIN WITH MINERALS) tablet Take 1 tablet by mouth daily.    . penicillin v potassium (VEETID) 500 MG tablet Take 1  tablet (500 mg total) by mouth 4 (four) times daily. 40 tablet 0   No current facility-administered medications for this visit.     Allergies:   Patient has no known allergies.    Social History:  The patient  reports that he has never smoked. He has never used smokeless tobacco. He reports that he drinks alcohol. He reports that he does not use drugs.   Family History:  The patient's family history includes Hypertension in his mother; Kidney disease in his father.    ROS: All other systems are reviewed and negative. Unless otherwise mentioned in H&P    PHYSICAL EXAM: VS:  There were no vitals taken for this visit. , BMI There is no height or weight on file to calculate BMI. GEN: Well nourished, well developed, in no acute distress HEENT: normal Neck: no JVD, carotid bruits, or masses Cardiac: ***RRR; no murmurs, rubs, or gallops,no edema  Respiratory:  clear to auscultation bilaterally, normal work of breathing GI: soft, nontender, nondistended, + BS MS: no deformity or atrophy Skin: warm and dry, no rash Neuro:  Strength and sensation are intact Psych: euthymic mood, full affect   EKG:  EKG {ACTION; IS/IS [id number]} ordered today. The ekg ordered today demonstrates ***   Recent Labs: No results found for requested labs within last 8760  hours.    Lipid Panel No results found for: CHOL, TRIG, HDL, CHOLHDL, VLDL, LDLCALC, LDLDIRECT    Wt Readings from Last 3 Encounters:  02/23/18 180 lb (81.6 kg)  05/11/17 180 lb (81.6 kg)  11/13/15 171 lb (77.6 kg)      Other studies Reviewed: Additional studies/ records that were reviewed today include: ***. Review of the above records demonstrates: ***   ASSESSMENT AND PLAN:  1.  ***   Current medicines are reviewed at length with the patient today.    Labs/ tests ordered today include: *** [name]. Liborio Nixon, ANP, AACC   04/04/2018 7:33 AM    [facility] [address],  [place], [place] [address] Phone: [phone]; Fax: [phone]

## 2018-05-04 ENCOUNTER — Ambulatory Visit: Payer: BLUE CROSS/BLUE SHIELD | Admitting: Adult Health

## 2018-05-04 NOTE — Progress Notes (Deleted)
Cardiology Office Note   Date:  05/04/2018   ID:  [name] [name], DOB [birth year], MRN [id number]  PCP:  Dorothyann Peng, MD  Cardiologist:  New No chief complaint on file.    History of Present Illness: [name] [name] is a 47 y.o. male who presents for ***    Past Medical History:  Diagnosis Date  . GERD   . LYMPHADENOPATHY   . MIGRAINE HEADACHE   . PERICARDITIS, ACUTE   . POSITIVE PPD     Past Surgical History:  Procedure Laterality Date  . hand fracture repair    . HERNIA REPAIR       Current Outpatient Medications  Medication Sig Dispense Refill  . acetaminophen (TYLENOL) 500 MG tablet Take 500 mg by mouth every 6 (six) hours as needed. Pain    . cyclobenzaprine (FLEXERIL) 10 MG tablet Take 1 tablet (10 mg total) by mouth 3 (three) times daily as needed for muscle spasms. 30 tablet 0  . esomeprazole (NEXIUM) 40 MG capsule Take 40 mg by mouth daily before breakfast. 1 tab po twice a week    . ibuprofen (ADVIL,MOTRIN) 800 MG tablet Take 1 tablet (800 mg total) by mouth 3 (three) times daily. 21 tablet 0  . ibuprofen (ADVIL,MOTRIN) 800 MG tablet Take 1 tablet (800 mg total) by mouth every 8 (eight) hours as needed for mild pain or moderate pain. 15 tablet 0  . levofloxacin (LEVAQUIN) 500 MG tablet Take 1 tablet (500 mg total) by mouth daily. 6 tablet 0  . magic mouthwash w/lidocaine SOLN Take 5 mLs by mouth 4 (four) times daily as needed for mouth pain. 150 mL 0  . methocarbamol (ROBAXIN) 500 MG tablet Take 1 tablet (500 mg total) by mouth 2 (two) times daily. 20 tablet 0  . Multiple Vitamins-Minerals (MULTIVITAMIN WITH MINERALS) tablet Take 1 tablet by mouth daily.    . penicillin v potassium (VEETID) 500 MG tablet Take 1 tablet (500 mg total) by mouth 4 (four) times daily. 40 tablet 0   No current facility-administered medications for this visit.     Allergies:   Patient has no known allergies.    Social History:  The patient  reports that he has never  smoked. He has never used smokeless tobacco. He reports that he drinks alcohol. He reports that he does not use drugs.   Family History:  The patient's family history includes Hypertension in his mother; Kidney disease in his father.    ROS: All other systems are reviewed and negative. Unless otherwise mentioned in H&P    PHYSICAL EXAM: VS:  There were no vitals taken for this visit. , BMI There is no height or weight on file to calculate BMI. GEN: Well nourished, well developed, in no acute distress HEENT: normal Neck: no JVD, carotid bruits, or masses Cardiac: ***RRR; no murmurs, rubs, or gallops,no edema  Respiratory:  clear to auscultation bilaterally, normal work of breathing GI: soft, nontender, nondistended, + BS MS: no deformity or atrophy Skin: warm and dry, no rash Neuro:  Strength and sensation are intact Psych: euthymic mood, full affect   EKG:  EKG {ACTION; IS/IS [id number]} ordered today. The ekg ordered today demonstrates ***   Recent Labs: No results found for requested labs within last 8760 hours.    Lipid Panel No results found for: CHOL, TRIG, HDL, CHOLHDL, VLDL, LDLCALC, LDLDIRECT    Wt Readings from Last 3 Encounters:  02/23/18 180 lb (81.6 kg)  05/11/17 180 lb (81.6 kg)  11/13/15 171 lb (77.6 kg)      Other studies Reviewed: Additional studies/ records that were reviewed today include: ***. Review of the above records demonstrates: ***   ASSESSMENT AND PLAN:  1.  ***   Current medicines are reviewed at length with the patient today.    Labs/ tests ordered today include: *** Bettey MareKathryn M. Liborio NixonLawrence DNP, ANP, AACC   05/04/2018 8:18 AM    [facility] [address], [place], [place] [address] Phone: [phone]; Fax: [phone]

## 2018-05-29 NOTE — Progress Notes (Signed)
Cardiology Office Note   Date:  05/31/2018   ID:  [name] [name], DOB October 12, 1971, MRN [id number]  PCP:  Dorothyann PengSanders, Robyn, MD  Cardiologist:  Dr. Jens Somrenshaw  Chief Complaint  Patient presents with  . New Patient (Initial Visit)    went to see PCP, was told he was bradycardia, wanted to follow up, numbness in hands noted.     History of Present Illness: [name] [name] is a 47 y.o. male who presents for ongoing assessment and management with history of pericarditis, most recent echocardiogram in April 2011 revealed normal LV function with bilateral atrial enlargement.  The patient was last seen by Dr. Jens Somrenshaw on 02/11/2012 and had recurrent discomfort in his chest.  EKG at that time was normal.  The patient was to have a repeat echocardiogram and started on NSAIDs.  Echocardiogram dated 03/03/2012 revealed normal LV systolic function of 55% to 60%, there is no evidence of wall motion abnormalities wall thickness.  The left atrium was mildly dilated.  No further testing was warranted at that time.   He is here to be re-established after being seen by his PCP who noticed that his HR has remained bradycardic. He is completely asymptomatic with this. Denies chest pain, dizziness, fatigue, or near syncope. PCP has had blood drawn:TSH 1.171, potassium 4.0. Lipid profile was WNL for patient without CAD.   He works in a factory which makes capsules for medications. He has no family history of CAD. Mother with ESRD and father with HTN. He does not smoke or abuse ETOH.   Past Medical History:  Diagnosis Date  . GERD   . LYMPHADENOPATHY   . MIGRAINE HEADACHE   . PERICARDITIS, ACUTE   . POSITIVE PPD     Past Surgical History:  Procedure Laterality Date  . hand fracture repair    . HERNIA REPAIR       Current Outpatient Medications  Medication Sig Dispense Refill  . acetaminophen (TYLENOL) 500 MG tablet Take 500 mg by mouth every 6 (six) hours as needed. Pain    . cholecalciferol  (VITAMIN D) 1000 units tablet Take 5,000 Units by mouth daily.    . cyclobenzaprine (FLEXERIL) 10 MG tablet Take 1 tablet (10 mg total) by mouth 3 (three) times daily as needed for muscle spasms. 30 tablet 0  . esomeprazole (NEXIUM) 40 MG capsule Take 40 mg by mouth daily before breakfast. 1 tab po twice a week    . ibuprofen (ADVIL,MOTRIN) 800 MG tablet Take 1 tablet (800 mg total) by mouth every 8 (eight) hours as needed for mild pain or moderate pain. 15 tablet 0  . methocarbamol (ROBAXIN) 500 MG tablet Take 1 tablet (500 mg total) by mouth 2 (two) times daily. 20 tablet 0  . Multiple Vitamins-Minerals (MULTIVITAMIN WITH MINERALS) tablet Take 1 tablet by mouth daily.     No current facility-administered medications for this visit.     Allergies:   Patient has no known allergies.    Social History:  The patient  reports that he has never smoked. He has never used smokeless tobacco. He reports that he drinks alcohol. He reports that he does not use drugs.   Family History:  The patient's family history includes Hypertension in his mother; Kidney disease in his father.    ROS: All other systems are reviewed and negative. Unless otherwise mentioned in H&P    PHYSICAL EXAM: VS:  BP 128/80 (BP Location: Left Arm, Patient Position: Sitting)   Pulse (!) 55  Ht 6' (1.829 m)   Wt 188 lb (85.3 kg)   BMI 25.50 kg/m  , BMI Body mass index is 25.5 kg/m. GEN: Well nourished, well developed, in no acute distress  HEENT: normal  Neck: no JVD, carotid bruits, or masses Cardiac: RRR; 1/6 systolic murmur heard best at the LSB and apex,no rubs, or gallops,no edema  Respiratory:  clear to auscultation bilaterally, normal work of breathing GI: soft, nontender, nondistended, + BS MS: no deformity or atrophy  Skin: warm and dry, no rash Neuro:  Strength and sensation are intact Psych: euthymic mood, full affect   EKG: Sinus bradycardia rate of 55 bpm with early repolarization with T-wave  abnormality in III. Compared to previous EKG it is essentially unchanged with the exception of Lead III.   Recent Labs: No results found for requested labs within last 8760 hours.    Lipid Panel No results found for: CHOL, TRIG, HDL, CHOLHDL, VLDL, LDLCALC, LDLDIRECT    Wt Readings from Last 3 Encounters:  05/31/18 188 lb (85.3 kg)  02/23/18 180 lb (81.6 kg)  05/11/17 180 lb (81.6 kg)      Other studies Reviewed: Echocardiogram 13-Mar-2012 Left ventricle: The cavity size was normal. Wall thickness was normal. Systolic function was normal. The estimated ejection fraction was in the range of 55% to 60%. - Left atrium: The atrium was mildly dilated. - Atrial septum: No defect or patent foramen ovale was identified. Aortic valve:  Structurally normal valve. Trileaflet. Cusp separation was normal. Doppler: Transvalvular velocity was within the normal range. There was no stenosis. No Regurgitation. Mitral valve:  Doppler:  Trivial regurgitation.  Peak gradient: 2mm Hg (D).  ASSESSMENT AND PLAN:  1. Bradycardia: Completely asymptomatic. He denies dizziness, chest pain or dyspnea. He is tired at the end of a 12 hour work day. I will order a POET to check for chronotropic incompetence and EKG changes with exercise with inferior changes seen.  TSH was WNL in March so I will not repeat labs at this time.     2. Mitral Valve murmur: I will check echo for comparison to previous EKG in 2013 for ongoing evaluation.    Current medicines are reviewed at length with the patient today.  I have recommended compression hose as he is on his feet at work and often has leg fatigue.   Labs/ tests ordered today include: GXT and Echo,  I have reviewed this patient with Dr. Allyson Sabal who is DOD in the office today. He agrees with my assessment and plan. The patient wishes to follow up with Dr. Jens Som as he has seen him in the past.    [name]. Liborio Nixon, ANP, AACC   05/31/2018 10:38  AM    [facility] [address], [place], [place] [address] Phone: [phone]; Fax: [phone]

## 2018-05-31 ENCOUNTER — Encounter: Payer: Self-pay | Admitting: Adult Health

## 2018-05-31 ENCOUNTER — Ambulatory Visit: Payer: BLUE CROSS/BLUE SHIELD | Admitting: Adult Health

## 2018-05-31 VITALS — BP 128/80 | HR 55 | Ht 72.0 in | Wt 188.0 lb

## 2018-05-31 DIAGNOSIS — I080 Rheumatic disorders of both mitral and aortic valves: Secondary | ICD-10-CM

## 2018-05-31 DIAGNOSIS — I340 Nonrheumatic mitral (valve) insufficiency: Secondary | ICD-10-CM | POA: Diagnosis not present

## 2018-05-31 DIAGNOSIS — R001 Bradycardia, unspecified: Secondary | ICD-10-CM

## 2018-05-31 NOTE — Patient Instructions (Signed)
Medication Instructions:  NO CHANGES- Your physician recommends that you continue on your current medications as directed. Please refer to the Current Medication list given to you today.  If you need a refill on your cardiac medications before your next appointment, please call your pharmacy.  Testing/Procedures: Echocardiogram - Your physician has requested that you have an echocardiogram. Echocardiography is a painless test that uses sound waves to create images of your heart. It provides your doctor with information about the size and shape of your heart and how well your heart's chambers and valves are working. This procedure takes approximately one hour. There are no restrictions for this procedure. [facility] at [facility] - [address], [address].  Your physician has requested that you have an exercise tolerance test, this is a screening tool to track your fitness level. This test evaluates the your exercise capacity by measuring cardiovascular response to exercise.  Graded exercise test is also known as maximal exercise test or stress EKG test. Please also follow instruction sheet given.  Follow-Up: Your physician wants you to follow-up in: AFTER WITH DR CRENSHAW.   Thank you for choosing [facility]!!

## 2018-06-10 ENCOUNTER — Ambulatory Visit (HOSPITAL_COMMUNITY): Payer: BLUE CROSS/BLUE SHIELD | Attending: Cardiovascular Disease

## 2018-06-10 ENCOUNTER — Other Ambulatory Visit: Payer: Self-pay

## 2018-06-10 DIAGNOSIS — I340 Nonrheumatic mitral (valve) insufficiency: Secondary | ICD-10-CM

## 2018-06-10 DIAGNOSIS — I080 Rheumatic disorders of both mitral and aortic valves: Secondary | ICD-10-CM

## 2018-06-15 ENCOUNTER — Telehealth (HOSPITAL_COMMUNITY): Payer: Self-pay

## 2018-06-15 ENCOUNTER — Telehealth (HOSPITAL_COMMUNITY): Payer: Self-pay | Admitting: Adult Health

## 2018-06-15 NOTE — Telephone Encounter (Signed)
Encounter complete. 

## 2018-06-17 ENCOUNTER — Ambulatory Visit (HOSPITAL_COMMUNITY)
Admission: RE | Admit: 2018-06-17 | Discharge: 2018-06-17 | Disposition: A | Discharge status: Home / Self Care | Payer: BLUE CROSS/BLUE SHIELD | Source: Ambulatory Visit | Attending: Cardiology | Admitting: Cardiology

## 2018-06-17 ENCOUNTER — Encounter (HOSPITAL_COMMUNITY): Payer: Self-pay | Admitting: *Deleted

## 2018-06-17 DIAGNOSIS — I340 Nonrheumatic mitral (valve) insufficiency: Secondary | ICD-10-CM | POA: Diagnosis not present

## 2018-06-17 DIAGNOSIS — I080 Rheumatic disorders of both mitral and aortic valves: Secondary | ICD-10-CM

## 2018-06-17 DIAGNOSIS — R001 Bradycardia, unspecified: Secondary | ICD-10-CM | POA: Diagnosis not present

## 2018-06-17 LAB — EXERCISE TOLERANCE TEST
Estimated workload: 13.4 METS
Exercise duration (min): 11 min
Exercise duration (sec): 1 s
MPHR: 174 {beats}/min
Peak HR: 162 {beats}/min
Percent HR: 93 %
RPE: 19
Rest HR: 59 {beats}/min

## 2018-06-17 NOTE — Telephone Encounter (Signed)
06/15/2018 08:33 AM Phone (Outgoing) Eugenie NorrieRawlinson, Baudelio D (Self) [phone] (H)   Left Message - Called pt to confirm ETT appt for 06/17/18 lmsg to cb to confirm..RG    By Trina AoGriffin, Rayelynn Loyal A    06/16/2018 11:15 AM Phone ([address]Outgoing) Eugenie NorrieRawlinson, Judge D (Self) [phone] (H)   Left Message - Called pt and lmsg for him to CB to confirm appt for 06/17/18.Marland Kitchen.RG    By Elita BooneGriffin, Jaedan Schuman A

## 2018-06-17 NOTE — Progress Notes (Unsigned)
Abnormal ETT was reviewed by Dr. Harding. Patient was given the ok to be discharged to go home. 

## 2018-06-22 NOTE — Progress Notes (Signed)
Cardiology Office Note   Date:  06/22/2018   ID:  [name] [name], DOB 1971/10/30, MRN [id number]  PCP:  Dorothyann PengSanders, Robyn, MD  Cardiologist: Winfield Rastr.Crenshaw  No chief complaint on file.    History of Present Illness: [name] [name] is a 47 y.o. male who presents for ongoing assessment and management of pericarditis, with recent visit due to bradycardia. He was given GXT to evaluate for chronotropic incompetence. Due to mitral valve murmur I also ordered an echocardiogram   Stress test completed on 06/17/2018 was normal with normal HR response, no evidence of chronotropic incompetence. Echocardiogram completed on 06/10/2018 demonstrated normal EF 60%-65% with mild concentric hypertrophy. Mildly thickened MV and AV leaflets. No stenosis or regurg.   He is doing well. 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 He denies symptoms. He wants to go back to working out at the gym.    Past Medical History:  Diagnosis Date  . GERD   . LYMPHADENOPATHY   . MIGRAINE HEADACHE   . PERICARDITIS, ACUTE   . POSITIVE PPD     Past Surgical History:  Procedure Laterality Date  . hand fracture repair    . HERNIA REPAIR       Current Outpatient Medications  Medication Sig Dispense Refill  . acetaminophen (TYLENOL) 500 MG tablet Take 500 mg by mouth every 6 (six) hours as needed. Pain    . cholecalciferol (VITAMIN D) 1000 units tablet Take 5,000 Units by mouth daily.    . cyclobenzaprine (FLEXERIL) 10 MG tablet Take 1 tablet (10 mg total) by mouth 3 (three) times daily as needed for muscle spasms. 30 tablet 0  . esomeprazole (NEXIUM) 40 MG capsule Take 40 mg by mouth daily before breakfast. 1 tab po twice a week    . ibuprofen (ADVIL,MOTRIN) 800 MG tablet Take 1 tablet (800 mg total) by mouth every 8 (eight) hours as needed for mild pain or moderate pain. 15 tablet 0  . methocarbamol (ROBAXIN) 500 MG tablet Take 1 tablet (500 mg total) by mouth 2 (two) times daily. 20  tablet 0  . Multiple Vitamins-Minerals (MULTIVITAMIN WITH MINERALS) tablet Take 1 tablet by mouth daily.     No current facility-administered medications for this visit.     Allergies:   Patient has no known allergies.    Social History:  The patient  reports that he has never smoked. He has never used smokeless tobacco. He reports that he drinks alcohol. He reports that he does not use drugs.   Family History:  The patient's family history includes Hypertension in his mother; Kidney disease in his father.    ROS: All other systems are reviewed and negative. Unless otherwise mentioned in H&P    PHYSICAL EXAM: VS:  There were no vitals taken for this visit. , BMI There is no height or weight on file to calculate BMI. GEN: Well nourished, well developed, in no acute distress  HEENT: normal  Neck: no JVD, carotid bruits, or masses Cardiac: RRR; no murmurs, rubs, or gallops,no edema  Respiratory:  clear to auscultation bilaterally, normal work of breathing GI: soft, nontender, nondistended, + BS MS: no deformity or atrophy  Skin: warm and dry, no rash Neuro:  Strength and sensation are intact Psych: euthymic mood, full affect   EKG: Not completed this office visit.   Recent Labs: No results found for requested labs within last 8760 hours.    Lipid Panel No results found for: CHOL, TRIG, HDL, CHOLHDL, VLDL,  LDLCALC, LDLDIRECT    Wt Readings from Last 3 Encounters:  05/31/18 188 lb (85.3 kg)  02/23/18 180 lb (81.6 kg)  05/11/17 180 lb (81.6 kg)      Other studies Reviewed: Echocardiogram Jul 10, 2018 Left ventricle: The cavity size was normal. There was mild   concentric hypertrophy. Systolic function was normal. The   estimated ejection fraction was in the range of 60% to 65%. Wall   motion was normal; there were no regional wall motion   abnormalities. Left ventricular diastolic function parameters   were normal. - Atrial septum: No defect or patent foramen ovale was  identified  Stress GXT 06/17/2018  Study Highlights    Resting EKG showed normal sinus rhythm with early repolarization of normality.  The patient walks on a Bruce protocol treadmill test for 11 minutes and 1 second. He achieved a peak heart rate of 162 which is 93% predicted maximal heart rate.  At peak exercise there were no ST or T wave changes to suggest ischemia. There were no arrhythmias. The blood pressure response to exercise was normal.  This is interpreted as a negative stress test. There is no evidence of ischemia.     ASSESSMENT AND PLAN:  1. Bradycardia: Heart rate is normal. He completed stress test without evidence of chronotropic incompetence. No further changes.   2. Hx of Pericarditis: No further symptoms Echo essentially normal with mild LVH.   Current medicines are reviewed at length with the patient today.    Labs/ tests ordered today include: None  [name]. Liborio Nixon, ANP, AACC   06/22/2018 12:46 PM    [facility] [address] [place], [place] [address] Phone: [phone]; Fax: [phone]

## 2018-06-23 ENCOUNTER — Encounter: Payer: Self-pay | Admitting: Adult Health

## 2018-06-23 ENCOUNTER — Ambulatory Visit: Payer: BLUE CROSS/BLUE SHIELD | Admitting: Adult Health

## 2018-06-23 VITALS — BP 108/86 | HR 56 | Ht 71.0 in | Wt 182.0 lb

## 2018-06-23 DIAGNOSIS — I319 Disease of pericardium, unspecified: Secondary | ICD-10-CM | POA: Diagnosis not present

## 2018-06-23 NOTE — Patient Instructions (Signed)
Medication Instructions:  NO CHANGES- Your physician recommends that you continue on your current medications as directed. Please refer to the Current Medication list given to you today.  If you need a refill on your cardiac medications before your next appointment, please call your pharmacy.  Follow-Up: Your physician wants you to follow-up in: 12 months with Dr Jens Somrenshaw. You should receive a reminder letter in the mail two months in advance. If you do not receive a letter, please call our office June 2020 to schedule the Aug 2020 follow-up appointment.   [facility]

## 2018-09-02 ENCOUNTER — Ambulatory Visit
Admission: EM | Admit: 2018-09-02 | Discharge: 2018-09-02 | Disposition: A | Discharge status: Home / Self Care | Payer: BLUE CROSS/BLUE SHIELD | Attending: Internal Medicine | Admitting: Internal Medicine

## 2018-09-02 DIAGNOSIS — R200 Anesthesia of skin: Secondary | ICD-10-CM | POA: Diagnosis not present

## 2018-09-02 NOTE — ED Triage Notes (Signed)
Pt states he is getting numbness in his left hand since 1 this morning, states it's constant and never has happened before. Feels as if "he is not getting enough circulation" unsure if it is radiating up his arm he states he cannot tell. No weakness reported in his hand and just his left hand.

## 2018-09-02 NOTE — ED Provider Notes (Signed)
[facility]    CSN: [id number] Arrival date & time: 09/02/18  1742     History   Chief Complaint Chief Complaint  Patient presents with  . Numbness    HPI [name] [name] is a 47 y.o. male. He presents today with altered sensation in his left hand, diffuse, started at 1am today and has not resolved.  Different from previous episodes in that it has persisted beyond the time of awakening, usually if he wakes up with this he can move the hand/wrist around a bit and it resolves.  No weakness/clumsiness, not painful.   Had an unremarkable ETT and echo showing mild LVH a month or two ago, was able to go for 11 minutes on the treadmill.   Nonsmoker, no family members with CAD, not diabetic/hypertensive, no hyperlipidemia.   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   No change in ability to do usual activities, no chest discomfort/dyspnea/leg swelling.  HPI  Past Medical History:  Diagnosis Date  . GERD   . LYMPHADENOPATHY   . MIGRAINE HEADACHE   . PERICARDITIS, ACUTE   . POSITIVE PPD     Patient Active Problem List   Diagnosis Date Noted  . DYSPNEA 02/18/2010  . MITRAL REGURGITATION 02/21/2009  . CHEST PAIN 02/21/2009  . MIGRAINE HEADACHE 11/11/2006  . CONJUNCTIVITIS, ALLERGIC 11/11/2006  . PERICARDITIS, ACUTE 11/11/2006  . GERD 11/11/2006  . LYMPHADENOPATHY 11/11/2006  . POSITIVE PPD 11/11/2006    Past Surgical History:  Procedure Laterality Date  . hand fracture repair    . HERNIA REPAIR         Home Medications    Prior to Admission medications   Medication Sig Start Date End Date Taking? Authorizing Provider  acetaminophen (TYLENOL) 500 MG tablet Take 500 mg by mouth every 6 (six) hours as needed. Pain    [provider]  cholecalciferol (VITAMIN D) 1000 units tablet Take  5,000 Units by mouth daily.    [provider]  esomeprazole (NEXIUM) 40 MG capsule Take 40 mg by mouth daily before breakfast. 1 tab po twice a week    [provider]  ibuprofen (ADVIL,MOTRIN) 800 MG tablet Take 1 tablet (800 mg total) by mouth every 8 (eight) hours as needed for mild pain or moderate pain. 05/11/17   Renford Dills, NP  Multiple Vitamins-Minerals (MULTIVITAMIN WITH MINERALS) tablet Take 1 tablet by mouth daily.    [provider]    Family History Family History  Problem Relation Age of Onset  . Hypertension Mother   . Kidney disease Father     Social History Social History   Tobacco Use  . Smoking status: Never Smoker  . Smokeless tobacco: Never Used  Substance Use Topics  . Alcohol use: Yes    Comment: rarely  . Drug use: No     Allergies   Patient has no known allergies.   Review of Systems Review of Systems  All other systems reviewed and are negative.    Physical Exam Triage Vital Signs ED Triage Vitals [09/02/18 1753]  Enc Vitals Group     BP (!) 149/99     Pulse Rate (!) 58     Resp 18     Temp 98.3 F (36.8 C)     Temp Source Oral     SpO2 99 %  Weight 182 lb (82.6 kg)     Height      Pain Score 0     Pain Loc    Updated Vital Signs BP (!) 149/99 (BP Location: Right Arm)   Pulse (!) 58   Temp 98.3 F (36.8 C) (Oral)   Resp 18   Wt 82.6 kg   SpO2 99%   BMI 25.38 kg/m  Physical Exam  Constitutional: He is oriented to person, place, and time. No distress.  Alert, nicely groomed  HENT:  Head: Atraumatic.  Eyes:  Conjugate gaze, no eye redness/drainage  Neck: Neck supple.  Cardiovascular: Normal rate and regular rhythm.  Pulmonary/Chest: No respiratory distress.  Lungs clear, symmetric breath sounds  Abdominal: He exhibits no distension.  Musculoskeletal: Normal range of motion. He exhibits no edema.  Full symmetric ROM bilat wrists, hands, incl extension, flexion, open/close fists  strongly, rotate wrists.  Hands are warm.  Able to feel light touch bilat hands, left hand feels differently than right.  Neurological: He is alert and oriented to person, place, and time.  Skin: Skin is warm and dry.  No cyanosis  Nursing note and vitals reviewed.    Final Clinical Impressions(s) / UC Diagnoses   Final diagnoses:  Numbness of left hand     Discharge Instructions     No danger signs on exam today, altered sensation in left hand seems most likely due to something like carpal tunnel syndrome from sleep position or lifting/push/pulling heavy objects at work or possibly doing pushups a couple weeks ago.  Note for work tonight, to rest it.  Try ice to inside of left wrist for 5-10 minutes several times in the next 24-48 hours to see if improvement occurs.  Also try wearing splint on left wrist at bedtime for the next week or two.  Anticipate gradual improvement in left hand sensation over the next week or two.  Recheck or followup with your primary care provider to discuss next steps if this does not occur.  Go to the ED if unusual weakness or clumsiness of the hand occurs.     Isa Rankin, MD 09/03/18 [phone]

## 2018-09-02 NOTE — Discharge Instructions (Addendum)
No danger signs on exam today, altered sensation in left hand seems most likely due to something like carpal tunnel syndrome from sleep position or lifting/push/pulling heavy objects at work or possibly doing pushups a couple weeks ago.  Note for work tonight, to rest it.  Try ice to inside of left wrist for 5-10 minutes several times in the next 24-48 hours to see if improvement occurs.  Also try wearing splint on left wrist at bedtime for the next week or two.  Anticipate gradual improvement in left hand sensation over the next week or two.  Recheck or followup with your primary care provider to discuss next steps if this does not occur.  Go to the ED if unusual weakness or clumsiness of the hand occurs.

## 2018-11-14 ENCOUNTER — Other Ambulatory Visit: Payer: Self-pay

## 2018-11-14 ENCOUNTER — Ambulatory Visit
Admission: EM | Admit: 2018-11-14 | Discharge: 2018-11-14 | Disposition: A | Discharge status: Home / Self Care | Payer: BLUE CROSS/BLUE SHIELD | Attending: Family Medicine | Admitting: Family Medicine

## 2018-11-14 DIAGNOSIS — J069 Acute upper respiratory infection, unspecified: Secondary | ICD-10-CM | POA: Insufficient documentation

## 2018-11-14 DIAGNOSIS — B9789 Other viral agents as the cause of diseases classified elsewhere: Secondary | ICD-10-CM | POA: Insufficient documentation

## 2018-11-14 MED ORDER — BENZONATATE 100 MG PO CAPS: 100.0000 mg | ORAL_CAPSULE | Freq: Three times a day (TID) | ORAL | 0 refills | Status: DC | PRN

## 2018-11-14 MED ORDER — IPRATROPIUM BROMIDE 0.06 % NA SOLN: 2.0000 | Freq: Four times a day (QID) | NASAL | 0 refills | Status: DC | PRN

## 2018-11-14 MED ORDER — PREDNISONE 50 MG PO TABS: ORAL_TABLET | ORAL | 0 refills | Status: DC

## 2018-11-14 NOTE — ED Triage Notes (Signed)
Patient complains of cough, congestion that started 10 days ago. Patient states that he felt like he was getting better but has worsened again.

## 2018-11-14 NOTE — ED Provider Notes (Signed)
[facility]    CSN: [id number] Arrival date & time: 11/14/18  0825  History   Chief Complaint Chief Complaint  Patient presents with  . Cough   HPI   47 year old male presents with rhinorrhea and cough.  10-day history of rhinorrhea and cough.  Also reports chest congestion.  He states that his cough is intermittently productive.  Cough seems to be his predominant symptom.  He has taken NyQuil, DayQuil, and Robitussin with some improvement but no resolution.  No fever.  No shortness of breath.  He does note chest discomfort from a cough.  No known exacerbating factors.  No other associated symptoms.  No other complaints  History reviewed as below. Past Medical History:  Diagnosis Date  . GERD   . LYMPHADENOPATHY   . MIGRAINE HEADACHE   . PERICARDITIS, ACUTE   . POSITIVE PPD    Patient Active Problem List   Diagnosis Date Noted  . DYSPNEA 02/18/2010  . MITRAL REGURGITATION 02/21/2009  . CHEST PAIN 02/21/2009  . MIGRAINE HEADACHE 11/11/2006  . CONJUNCTIVITIS, ALLERGIC 11/11/2006  . PERICARDITIS, ACUTE 11/11/2006  . GERD 11/11/2006  . LYMPHADENOPATHY 11/11/2006  . POSITIVE PPD 11/11/2006   Past Surgical History:  Procedure Laterality Date  . hand fracture repair    . HERNIA REPAIR      Home Medications    Prior to Admission medications   Medication Sig Start Date End Date Taking? Authorizing Provider  acetaminophen (TYLENOL) 500 MG tablet Take 500 mg by mouth every 6 (six) hours as needed. Pain   Yes [provider]  cholecalciferol (VITAMIN D) 1000 units tablet Take 5,000 Units by mouth daily.   Yes [provider]  esomeprazole (NEXIUM) 40 MG capsule Take 40 mg by mouth daily before breakfast. 1 tab po twice a week   Yes [provider]  ibuprofen (ADVIL,MOTRIN) 800 MG tablet Take 1 tablet (800 mg total) by mouth every 8 (eight) hours as needed for mild pain or moderate pain. 05/11/17  Yes Renford DillsMiller, Lindsey, NP  Multiple  Vitamins-Minerals (MULTIVITAMIN WITH MINERALS) tablet Take 1 tablet by mouth daily.   Yes [provider]  benzonatate (TESSALON) 100 MG capsule Take 1 capsule (100 mg total) by mouth 3 (three) times daily as needed. 11/14/18   Everlene Otherook, Tavone Caesar G, DO  ipratropium (ATROVENT) 0.06 % nasal spray Place 2 sprays into both nostrils 4 (four) times daily as needed for rhinitis. 11/14/18   Tommie Samsook, Marvis Saefong G, DO  predniSONE (DELTASONE) 50 MG tablet 1 tablet daily x 5 days 11/14/18   Tommie Samsook, Damira Kem G, DO    Family History Family History  Problem Relation Age of Onset  . Hypertension Mother   . Kidney disease Father     Social History Social History   Tobacco Use  . Smoking status: Never Smoker  . Smokeless tobacco: Never Used  Substance Use Topics  . Alcohol use: Yes    Comment: rarely  . Drug use: No     Allergies   Patient has no known allergies.   Review of Systems Review of Systems  Constitutional: Negative for fever.  HENT: Positive for rhinorrhea.   Respiratory: Positive for cough.    Physical Exam Triage Vital Signs ED Triage Vitals  Enc Vitals Group     BP 11/14/18 0837 (!) 141/99     Pulse Rate 11/14/18 0837 62     Resp 11/14/18 0837 18     Temp 11/14/18 0837 98.4 F (36.9 C)  Temp Source 11/14/18 0837 Oral     SpO2 11/14/18 0837 97 %     Weight 11/14/18 0834 180 lb (81.6 kg)     Height 11/14/18 0834 6' (1.829 m)     Head Circumference --      Peak Flow --      Pain Score 11/14/18 0834 6     Pain Loc --      Pain Edu? --      Excl. in GC? --    Updated Vital Signs BP (!) 141/99 (BP Location: Left Arm)   Pulse 62   Temp 98.4 F (36.9 C) (Oral)   Resp 18   Ht 6' (1.829 m)   Wt 81.6 kg   SpO2 97%   BMI 24.41 kg/m   Visual Acuity Right Eye Distance:   Left Eye Distance:   Bilateral Distance:    Right Eye Near:   Left Eye Near:    Bilateral Near:     Physical Exam Vitals signs and nursing note reviewed.  Constitutional:      General: He is not  in acute distress.    Appearance: Normal appearance.  HENT:     Head: Normocephalic and atraumatic.     Right Ear: Tympanic membrane normal.     Left Ear: Tympanic membrane normal.     Nose: Congestion present.     Mouth/Throat:     Comments: Oropharynx with moderate erythema. Eyes:     General:        Right eye: No discharge.        Left eye: No discharge.     Conjunctiva/sclera: Conjunctivae normal.  Cardiovascular:     Rate and Rhythm: Normal rate and regular rhythm.  Pulmonary:     Effort: Pulmonary effort is normal. No respiratory distress.     Breath sounds: No wheezing, rhonchi or rales.  Neurological:     Mental Status: He is alert.  Psychiatric:        Mood and Affect: Mood normal.        Behavior: Behavior normal.    UC Treatments / Results  Labs (all labs ordered are listed, but only abnormal results are displayed) Labs Reviewed - No data to display  EKG None  Radiology No results found.  Procedures Procedures (including critical care time)  Medications Ordered in UC Medications - No data to display  Initial Impression / Assessment and Plan / UC Course  I have reviewed the triage vital signs and the nursing notes.  Pertinent labs & imaging results that were available during my care of the patient were reviewed by me and considered in my medical decision making (see chart for details).    47 year old male presents with viral URI with cough.  Treating with prednisone, Tessalon Perles, Atrovent nasal spray.  Final Clinical Impressions(s) / UC Diagnoses   Final diagnoses:  Viral URI with cough     Discharge Instructions     This is all viral.  The medications that I prescribed will help. Cough may last for several weeks.  If you worsen or develop fever please let us know.  Dr. Adriana Simas    ED Prescriptions    Medication Sig Dispense Auth. Provider   ipratropium (ATROVENT) 0.06 % nasal spray Place 2 sprays into both nostrils 4 (four) times daily  as needed for rhinitis. 15 mL Lem Peary G, DO   predniSONE (DELTASONE) 50 MG tablet 1 tablet daily x 5 days 5 tablet Muscoy, Duncombe G, DO  benzonatate (TESSALON) 100 MG capsule Take 1 capsule (100 mg total) by mouth 3 (three) times daily as needed. 30 capsule Tommie Samsook, Kraven Calk G, DO     Controlled Substance Prescriptions [place] Controlled Substance Registry consulted? Not Applicable   Tommie SamsCook, Ariahna Smiddy G, [place]DO 11/14/18 [phone]

## 2018-11-14 NOTE — Discharge Instructions (Signed)
This is all viral.  The medications that I prescribed will help. Cough may last for several weeks.  If you worsen or develop fever please let us know.  Dr. Adriana Simasook

## 2018-12-26 ENCOUNTER — Ambulatory Visit: Payer: BLUE CROSS/BLUE SHIELD | Admitting: Cardiology

## 2018-12-28 NOTE — Progress Notes (Signed)
Cardiology Office Note   Date:  12/29/2018   ID:  [name] [name], DOB June 26, 1971, MRN [id number]  PCP:  [name] Peng, MD  Cardiologist:  Dr. Jens [name]  Chief Complaint  Patient presents with  . Chest Pain   History of Present Illness: [name] [name] is a 48 y.o. male who presents for ongoing assessment and management of hx of pericarditis, bradycardia.   Stress test completed on 06/17/2018 was normal with normal HR response, no evidence of chronotropic incompetence. Echocardiogram completed on 06/10/2018 demonstrated normal EF 60%-65% with mild concentric hypertrophy. Mildly thickened MV and AV leaflets. No stenosis or regurg.    He comes today with recurrent symptoms similar to pericarditis symptoms in July of 2019. Chest heaviness and pressure. No pain with inspiration, or torso movement. He is concerned as the symptoms are reminiscent of the prior presentation.   He was recently treated for a "cold" at Urgent care, with associated coughing, with two courses of antibiotics.   Past Medical History:  Diagnosis Date  . GERD   . LYMPHADENOPATHY   . MIGRAINE HEADACHE   . PERICARDITIS, ACUTE   . POSITIVE PPD     Past Surgical History:  Procedure Laterality Date  . hand fracture repair    . HERNIA REPAIR       Current Outpatient Medications  Medication Sig Dispense Refill  . acetaminophen (TYLENOL) 500 MG tablet Take 500 mg by mouth every 6 (six) hours as needed. Pain    . esomeprazole (NEXIUM) 40 MG capsule Take 40 mg by mouth daily as needed.     Marland Kitchen ibuprofen (ADVIL,MOTRIN) 800 MG tablet Take 1 tablet (800 mg total) by mouth every 8 (eight) hours as needed for mild pain or moderate pain. 15 tablet 0  . Multiple Vitamins-Minerals (MULTIVITAMIN WITH MINERALS) tablet Take 1 tablet by mouth daily.     No current facility-administered medications for this visit.     Allergies:   Patient has no known allergies.    Social History:  The patient  reports that he has never  smoked. He has never used smokeless tobacco. He reports current alcohol use. He reports that he does not use drugs.   Family History:  The patient's family history includes Hypertension in his mother; Kidney disease in his father.    ROS: All other systems are reviewed and negative. Unless otherwise mentioned in H&P    PHYSICAL EXAM: VS:  BP 118/70   Pulse 61   Ht 6' (1.829 m)   Wt 189 lb 3.2 oz (85.8 kg)   BMI 25.66 kg/m  , BMI Body mass index is 25.66 kg/m. GEN: Well nourished, well developed, in no acute distress HEENT: normal Neck: no JVD, carotid bruits, or masses Cardiac: RRR; a rumbling systolic murmur, no rubs, or gallops,no edema  Respiratory:  Clear to auscultation bilaterally, normal work of breathing GI: soft, nontender, nondistended, + BS MS: no deformity or atrophy Skin: warm and dry, no rash Neuro:  Strength and sensation are intact Psych: euthymic mood, full affect   EKG:  NSR with T-wave inversion in III (unchanged), and T-wave abnormality V5-V6.  No elevation.  Recent Labs: No results found for requested labs within last 8760 hours.    Lipid Panel No results found for: CHOL, TRIG, HDL, CHOLHDL, VLDL, LDLCALC, LDLDIRECT    Wt Readings from Last 3 Encounters:  12/29/18 189 lb 3.2 oz (85.8 kg)  11/14/18 180 lb (81.6 kg)  09/02/18 182 lb (82.6 kg)  Other studies Reviewed: Echocardiogram 06-16-2018 Left ventricle: The cavity size was normal. There was mild concentric hypertrophy. Systolic function was normal. The estimated ejection fraction was in the range of 60% to 65%. Wall motion was normal; there were no regional wall motion abnormalities. Left ventricular diastolic function parameters were normal. - Atrial septum: No defect or patent foramen ovale was identified  Stress GXT 06/17/2018  Study Highlights    Resting EKG showed normal sinus rhythm with early repolarization of normality.  The patient walks on a Bruce protocol  treadmill test for 11 minutes and 1 second. He achieved a peak heart rate of 162 which is 93% predicted maximal heart rate.  At peak exercise there were no ST or T wave changes to suggest ischemia. There were no arrhythmias. The blood pressure response to exercise was normal.  This is interpreted as a negative stress test. There is no evidence of ischemia.     ASSESSMENT AND PLAN:  1. Chest pain: Similar to pericarditis pain although not as severe. I will check a CBC and Sed rate to evaluate for this. EKG is not indicative of this so far. Stress test in 06/2018 was negative for ischemia. Check echo.   He will be sent a Rx for colchicine 0.6 mg daily, and 10 tablets of tramadol for pain (no refills).   2. Hx of Pericarditis: Testing as above.  Current medicines are reviewed at length with the patient today.    Labs/ tests ordered today include: CBC,Sed rate.  Echocardiogram  [name]. [name] [name], ANP, AACC   12/29/2018 8:22 AM    [facility] [address] Office [phone] Fax [phone]

## 2018-12-29 ENCOUNTER — Ambulatory Visit: Payer: BLUE CROSS/BLUE SHIELD | Admitting: Adult Health

## 2018-12-29 ENCOUNTER — Encounter: Payer: Self-pay | Admitting: Adult Health

## 2018-12-29 VITALS — BP 118/70 | HR 61 | Ht 72.0 in | Wt 189.2 lb

## 2018-12-29 DIAGNOSIS — I358 Other nonrheumatic aortic valve disorders: Secondary | ICD-10-CM

## 2018-12-29 DIAGNOSIS — I309 Acute pericarditis, unspecified: Secondary | ICD-10-CM | POA: Diagnosis not present

## 2018-12-29 DIAGNOSIS — I319 Disease of pericardium, unspecified: Secondary | ICD-10-CM | POA: Diagnosis not present

## 2018-12-29 DIAGNOSIS — Z79899 Other long term (current) drug therapy: Secondary | ICD-10-CM | POA: Diagnosis not present

## 2018-12-29 MED ORDER — TRAMADOL HCL 50 MG PO TABS: 50.0000 mg | ORAL_TABLET | Freq: Three times a day (TID) | ORAL | 0 refills | Status: DC | PRN

## 2018-12-29 MED ORDER — COLCHICINE 0.6 MG PO TABS: 0.6000 mg | ORAL_TABLET | Freq: Every day | ORAL | 6 refills | Status: DC

## 2018-12-29 NOTE — Patient Instructions (Signed)
Medication Instructions:  Start colchicine 0.6mg  daily Ok to take tramadol 50mg  every 8 hours If you need a refill on your cardiac medications before your next appointment, please call your pharmacy.  Labwork: CBC AND SED RATE TODAY HERE IN OUR OFFICE AT [facility]   Take the provided lab slips with you to the lab for your blood draw.   When you have your labs (blood work) drawn today and your tests are completely normal, you will receive your results only by MyChart Message (if you have MyChart) -OR-  A paper copy in the mail.  If you have any lab test that is abnormal or we need to change your treatment, we will call you to review these results.  Testing/Procedures: Echocardiogram - Your physician has requested that you have an echocardiogram. Echocardiography is a painless test that uses sound waves to create images of your heart. It provides your doctor with information about the size and shape of your heart and how well your heart's chambers and valves are working. This procedure takes approximately one hour. There are no restrictions for this procedure. This will be performed at our [facility] location - [address], [address].  Follow-Up: You will need a follow up appointment in 6 weeks.  You may see Olga Millers, MD Joni Reining, DNP, AACC or one of the following Advanced Practice Providers on your designated Care Team: Corine Shelter, PA-C Judy Pimple, PA-C  Marjie Skiff, [place]     At [facility], you and your health needs are our priority.  As part of our continuing mission to provide you with exceptional heart care, we have created designated Provider Care Teams.  These Care Teams include your primary Cardiologist (physician) and Advanced Practice Providers (APPs -  Physician Assistants and Nurse Practitioners) who all work together to provide you with the care you need, when you need it.  Thank you for choosing [facility] at [facility]!!

## 2018-12-30 ENCOUNTER — Encounter: Payer: Self-pay | Admitting: *Deleted

## 2018-12-30 LAB — CBC
Hematocrit: 42 % (ref 37.5–51.0)
Hemoglobin: 14.8 g/dL (ref 13.0–17.7)
MCH: 32.2 pg (ref 26.6–33.0)
MCHC: 35.2 g/dL (ref 31.5–35.7)
MCV: 92 fL (ref 79–97)
Platelets: 213 10*3/uL (ref 150–450)
RBC: 4.59 x10E6/uL (ref 4.14–5.80)
RDW: 13.2 % (ref 11.6–15.4)
WBC: 4.6 10*3/uL (ref 3.4–10.8)

## 2018-12-30 LAB — SEDIMENTATION RATE: Sed Rate: 5 mm/hr (ref 0–15)

## 2019-01-05 ENCOUNTER — Ambulatory Visit (HOSPITAL_COMMUNITY): Payer: BLUE CROSS/BLUE SHIELD | Attending: Cardiovascular Disease

## 2019-01-05 DIAGNOSIS — I319 Disease of pericardium, unspecified: Secondary | ICD-10-CM

## 2019-01-05 DIAGNOSIS — I358 Other nonrheumatic aortic valve disorders: Secondary | ICD-10-CM | POA: Insufficient documentation

## 2019-01-06 ENCOUNTER — Encounter: Payer: Self-pay | Admitting: Nurse Practitioner

## 2019-01-06 ENCOUNTER — Other Ambulatory Visit: Payer: Self-pay

## 2019-01-06 ENCOUNTER — Encounter: Payer: Self-pay | Admitting: Emergency Medicine

## 2019-01-06 ENCOUNTER — Ambulatory Visit (INDEPENDENT_AMBULATORY_CARE_PROVIDER_SITE_OTHER): Payer: BLUE CROSS/BLUE SHIELD

## 2019-01-06 ENCOUNTER — Ambulatory Visit
Admission: EM | Admit: 2019-01-06 | Discharge: 2019-01-06 | Disposition: A | Discharge status: Home / Self Care | Payer: BLUE CROSS/BLUE SHIELD | Attending: Family Medicine | Admitting: Family Medicine

## 2019-01-06 DIAGNOSIS — M2142 Flat foot [pes planus] (acquired), left foot: Secondary | ICD-10-CM

## 2019-01-06 DIAGNOSIS — M79671 Pain in right foot: Secondary | ICD-10-CM | POA: Diagnosis not present

## 2019-01-06 DIAGNOSIS — M2141 Flat foot [pes planus] (acquired), right foot: Secondary | ICD-10-CM

## 2019-01-06 MED ORDER — MELOXICAM 15 MG PO TABS: 15.0000 mg | ORAL_TABLET | Freq: Every day | ORAL | 0 refills | Status: DC | PRN

## 2019-01-06 NOTE — ED Triage Notes (Signed)
Patient states that when he woke up this morning he had pain in his right foot.  Patient denies injury or fall.

## 2019-01-06 NOTE — Discharge Instructions (Signed)
Xray negative.  Good supportive shoes.  Medication as needed.  If persists, call [facility] clinic podiatry.  Take care  Dr. Adriana Simas

## 2019-01-06 NOTE — ED Provider Notes (Signed)
[facility]    CSN: [id number] Arrival date & time: 01/06/19  1413  History   Chief Complaint Chief Complaint  Patient presents with  . Foot Pain    right   HPI  48 year old male presents with right foot pain.  Patient reports that he woke up this morning with severe right foot pain.  No fall, trauma, injury.  Pain is located on the lateral aspect of his foot.  Patient does have known pes planus.  He has taken an over-the-counter analgesic without improvement.  Described as a burning sensation.  Pain at rest and with activity.  No relieving factors.  No other complaints.  PMH, Surgical Hx, Family Hx, Social History reviewed and updated as below.  Past Medical History:  Diagnosis Date  . GERD   . LYMPHADENOPATHY   . MIGRAINE HEADACHE   . PERICARDITIS, ACUTE   . POSITIVE PPD     Patient Active Problem List   Diagnosis Date Noted  . DYSPNEA 02/18/2010  . MITRAL REGURGITATION 02/21/2009  . CHEST PAIN 02/21/2009  . MIGRAINE HEADACHE 11/11/2006  . CONJUNCTIVITIS, ALLERGIC 11/11/2006  . PERICARDITIS, ACUTE 11/11/2006  . GERD 11/11/2006  . LYMPHADENOPATHY 11/11/2006  . POSITIVE PPD 11/11/2006    Past Surgical History:  Procedure Laterality Date  . hand fracture repair    . HERNIA REPAIR     Home Medications    Prior to Admission medications   Medication Sig Start Date End Date Taking? Authorizing Provider  colchicine 0.6 MG tablet Take 1 tablet (0.6 mg total) by mouth daily. 12/29/18  Yes Lewayne Bunting, MD  esomeprazole (NEXIUM) 40 MG capsule Take 40 mg by mouth daily as needed.    Yes [provider]  Multiple Vitamins-Minerals (MULTIVITAMIN WITH MINERALS) tablet Take 1 tablet by mouth daily.   Yes [provider]  acetaminophen (TYLENOL) 500 MG tablet Take 500 mg by mouth every 6 (six) hours as needed. Pain    [provider]  meloxicam (MOBIC) 15 MG tablet Take 1 tablet (15 mg total) by mouth daily as needed for pain.  01/06/19   Tommie Sams, DO    Family History Family History  Problem Relation Age of Onset  . Hypertension Mother   . Kidney disease Father     Social History Social History   Tobacco Use  . Smoking status: Never Smoker  . Smokeless tobacco: Never Used  Substance Use Topics  . Alcohol use: Yes    Comment: rarely  . Drug use: No     Allergies   Patient has no known allergies.   Review of Systems Review of Systems  Constitutional: Negative.   Musculoskeletal:       Foot pain.   Physical Exam Triage Vital Signs ED Triage Vitals  Enc Vitals Group     BP 01/06/19 1431 132/86     Pulse Rate 01/06/19 1431 63     Resp 01/06/19 1431 14     Temp 01/06/19 1431 97.7 F (36.5 C)     Temp Source 01/06/19 1431 Oral     SpO2 01/06/19 1431 99 %     Weight 01/06/19 1429 185 lb (83.9 kg)     Height 01/06/19 1429 6' (1.829 m)     Head Circumference --      Peak Flow --      Pain Score 01/06/19 1429 8     Pain Loc --      Pain Edu? --  Excl. in GC? --    Updated Vital Signs BP 132/86 (BP Location: Left Arm)   Pulse 63   Temp 97.7 F (36.5 C) (Oral)   Resp 14   Ht 6' (1.829 m)   Wt 83.9 kg   SpO2 99%   BMI 25.09 kg/m   Visual Acuity Right Eye Distance:   Left Eye Distance:   Bilateral Distance:    Right Eye Near:   Left Eye Near:    Bilateral Near:     Physical Exam Vitals signs and nursing note reviewed.  Constitutional:      General: He is not in acute distress.    Appearance: Normal appearance.  HENT:     Head: Normocephalic and atraumatic.     Nose: Nose normal.  Eyes:     General:        Right eye: No discharge.        Left eye: No discharge.     Conjunctiva/sclera: Conjunctivae normal.  Pulmonary:     Effort: Pulmonary effort is normal. No respiratory distress.  Musculoskeletal:     Comments: Right foot -mild tenderness on the lateral aspect of the foot.  Pes planus noted.  Skin:    General: Skin is warm.     Findings: No rash.    Neurological:     Mental Status: He is alert.  Psychiatric:        Mood and Affect: Mood normal.        Behavior: Behavior normal.    UC Treatments / Results  Labs (all labs ordered are listed, but only abnormal results are displayed) Labs Reviewed - No data to display  EKG None  Radiology Dg Foot Complete Right  Result Date: 01/06/2019 CLINICAL DATA:  Right foot pain. EXAM: RIGHT FOOT COMPLETE - 3+ VIEW COMPARISON:  03/02/2012 FINDINGS: Curvilinear lucency through the dorsal surface of the navicular bone may represent a nondisplaced fracture or overlapping shadows. Flattening of the arch of the foot, stable. Soft tissues are unremarkable. IMPRESSION: Curvilinear lucency through the dorsal surface of the navicular bone may represent a nondisplaced fracture or overlapping shadows. Please correlate with point tenderness. Electronically Signed   By: Ted Mcalpine M.D.   On: 01/06/2019 15:40    Procedures Procedures (including critical care time)  Medications Ordered in UC Medications - No data to display  Initial Impression / Assessment and Plan / UC Course  I have reviewed the triage vital signs and the nursing notes.  Pertinent labs & imaging results that were available during my care of the patient were reviewed by me and considered in my medical decision making (see chart for details).    48 year old male presents with foot pain.  Has known pes planus.  X-ray negative.  Mobic as directed.  Advised to see podiatry.  Final Clinical Impressions(s) / UC Diagnoses   Final diagnoses:  Right foot pain  Pes planus of both feet     Discharge Instructions     Xray negative.  Good supportive shoes.  Medication as needed.  If persists, call [facility] clinic podiatry.  Take care  Dr. Adriana Simas    ED Prescriptions    Medication Sig Dispense Auth. Provider   meloxicam (MOBIC) 15 MG tablet Take 1 tablet (15 mg total) by mouth daily as needed for pain. 30 tablet Tommie Sams, DO     Controlled Substance Prescriptions [place] Controlled Substance Registry consulted? Not Applicable   Tommie Sams, DO 01/06/19 1619

## 2019-01-13 ENCOUNTER — Ambulatory Visit: Payer: BLUE CROSS/BLUE SHIELD | Admitting: Nurse Practitioner

## 2019-01-13 ENCOUNTER — Encounter: Payer: Self-pay | Admitting: Nurse Practitioner

## 2019-01-13 VITALS — BP 126/80 | HR 65 | Temp 97.9°F | Ht 70.0 in | Wt 185.4 lb

## 2019-01-13 DIAGNOSIS — E785 Hyperlipidemia, unspecified: Secondary | ICD-10-CM

## 2019-01-13 DIAGNOSIS — M79671 Pain in right foot: Secondary | ICD-10-CM

## 2019-01-13 DIAGNOSIS — Z0000 Encounter for general adult medical examination without abnormal findings: Secondary | ICD-10-CM | POA: Diagnosis not present

## 2019-01-13 DIAGNOSIS — Z125 Encounter for screening for malignant neoplasm of prostate: Secondary | ICD-10-CM | POA: Diagnosis not present

## 2019-01-13 DIAGNOSIS — E559 Vitamin D deficiency, unspecified: Secondary | ICD-10-CM | POA: Diagnosis not present

## 2019-01-13 DIAGNOSIS — Z139 Encounter for screening, unspecified: Secondary | ICD-10-CM | POA: Diagnosis not present

## 2019-01-13 LAB — POCT URINALYSIS DIP (PROADVANTAGE DEVICE)
Bilirubin, UA: NEGATIVE
Glucose, UA: NEGATIVE mg/dL
Ketones, POC UA: NEGATIVE mg/dL
Leukocytes, UA: NEGATIVE
Nitrite, UA: NEGATIVE
Protein Ur, POC: NEGATIVE mg/dL
Specific Gravity, Urine: 1.025
Urobilinogen, Ur: 0.2
pH, UA: 6 (ref 5.0–8.0)

## 2019-01-13 NOTE — Progress Notes (Signed)
Subjective:     Patient ID: [name] [name] , male    DOB: Jun 01, 1971 , 48 y.o.   MRN: [id number]   Chief Complaint  Patient presents with  . other    Non fasting CPE    Men's preventive visit. Patient Health Questionnaire (PHQ-2) is    Office Visit from 01/13/2019 in [facility]  PHQ-2 Total Score  0    Patient is on a regular diet. Marital status: Single. Relevant history for alcohol use is:   Social History   Substance and Sexual Activity  Alcohol Use Yes   Comment: rarely   Relevant history for tobacco use is:  Social History   Tobacco Use  Smoking Status Never Smoker  Smokeless Tobacco Never Used   Minimal amount of exercise.  Drinks mostly water.  HPI  Here for HM  Foot Injury   The incident occurred more than 1 week ago. There was no injury mechanism. The pain is present in the right foot. The quality of the pain is described as aching. Pertinent negatives include no inability to bear weight. He reports no foreign bodies present. Improvement on treatment: went to Urgent Care      Past Medical History:  Diagnosis Date  . GERD   . LYMPHADENOPATHY   . MIGRAINE HEADACHE   . PERICARDITIS, ACUTE   . POSITIVE PPD      Family History  Problem Relation Age of Onset  . Hypertension Mother   . Kidney disease Father      Current Outpatient Medications:  .  acetaminophen (TYLENOL) 500 MG tablet, Take 500 mg by mouth every 6 (six) hours as needed. Pain, Disp: , Rfl:  .  colchicine 0.6 MG tablet, Take 1 tablet (0.6 mg total) by mouth daily., Disp: 30 tablet, Rfl: 6 .  esomeprazole (NEXIUM) 40 MG capsule, Take 40 mg by mouth daily as needed. , Disp: , Rfl:  .  meloxicam (MOBIC) 15 MG tablet, Take 1 tablet (15 mg total) by mouth daily as needed for pain., Disp: 30 tablet, Rfl: 0 .  Multiple Vitamins-Minerals (MULTIVITAMIN WITH MINERALS) tablet, Take 1 tablet by mouth daily., Disp: , Rfl:    No Known Allergies   Review of Systems   Constitutional: Negative.   HENT: Negative.   Eyes: Negative.   Respiratory: Negative.   Cardiovascular: Negative.   Gastrointestinal: Negative.   Endocrine: Negative.   Genitourinary: Negative.   Musculoskeletal: Negative.   Skin: Negative.   Allergic/Immunologic: Negative.   Neurological: Negative.   Hematological: Negative.   Psychiatric/Behavioral: Negative.      Today's Vitals   01/13/19 0950  BP: 126/80  Pulse: 65  Temp: 97.9 F (36.6 C)  SpO2: 96%  Weight: 185 lb 6.4 oz (84.1 kg)  Height: 5\' 10"  (1.778 m)   Body mass index is 26.6 kg/m.   Objective:  Physical Exam Vitals signs reviewed.  Constitutional:      Appearance: Normal appearance. He is not ill-appearing.  HENT:     Head: Normocephalic and atraumatic.     Right Ear: Tympanic membrane, ear canal and external ear normal. There is no impacted cerumen.     Left Ear: Tympanic membrane, ear canal and external ear normal. There is no impacted cerumen.     Nose: Nose normal. No congestion.     Mouth/Throat:     Mouth: Mucous membranes are moist.  Eyes:     Extraocular Movements: Extraocular movements intact.     Conjunctiva/sclera: Conjunctivae normal.  Pupils: Pupils are equal, round, and reactive to light.  Neck:     Musculoskeletal: Normal range of motion and neck supple.  Cardiovascular:     Rate and Rhythm: Normal rate and regular rhythm.     Pulses: Normal pulses.     Heart sounds: Normal heart sounds. No murmur.  Pulmonary:     Effort: Pulmonary effort is normal. No respiratory distress.     Breath sounds: Normal breath sounds. No wheezing or rales.  Abdominal:     General: Abdomen is flat. Bowel sounds are normal.     Palpations: Abdomen is soft.  Genitourinary:    Prostate: Normal.     Rectum: Guaiac result negative.  Musculoskeletal: Normal range of motion.     Comments: Bilateral flat foot, no pain on palpation  Skin:    General: Skin is warm and dry.     Capillary Refill:  Capillary refill takes less than 2 seconds.  Neurological:     General: No focal deficit present.     Mental Status: He is alert.     Cranial Nerves: No cranial nerve deficit.  Psychiatric:        Mood and Affect: Mood normal.        Behavior: Behavior normal.        Thought Content: Thought content normal.        Judgment: Judgment normal.         Assessment And Plan:    1. Health maintenance examination . Behavior modifications discussed and diet history reviewed.   . Pt will continue to exercise regularly and modify diet with low GI, plant based foods and decrease intake of processed foods.  . Recommend intake of daily multivitamin, Vitamin D, and calcium.  . Recommend mammogram and colonoscopy for preventive screenings, as well as recommend immunizations that include influenza, TDAP - POCT Urinalysis DIP (Proadvantage Device) - CMP14 + Anion Gap  2. Right foot pain  He is flat footed  No pain ilicited on palpation - Ambulatory referral to Podiatry  3. Encounter for prostate cancer screening  - PSA  4. Vitamin D deficiency  Will check vitamin D level and supplement as needed.     Also encouraged to spend 15 minutes in the sun daily.  - Vitamin D (25 hydroxy)  5. Hyperlipidemia, unspecified hyperlipidemia type  Chronic, controlled  Continue with current medications - Lipid Profile  6. Encounter for screening  - HIV antibody (with reflex)   Arnette Felts, FNP

## 2019-01-14 LAB — VITAMIN D 25 HYDROXY (VIT D DEFICIENCY, FRACTURES): Vit D, 25-Hydroxy: 29.4 ng/mL — ABNORMAL LOW (ref 30.0–100.0)

## 2019-01-14 LAB — CMP14 + ANION GAP
ALT: 24 IU/L (ref 0–44)
AST: 26 IU/L (ref 0–40)
Albumin/Globulin Ratio: 1.7 (ref 1.2–2.2)
Albumin: 4.7 g/dL (ref 4.0–5.0)
Alkaline Phosphatase: 48 IU/L (ref 39–117)
Anion Gap: 17 mmol/L (ref 10.0–18.0)
BUN/Creatinine Ratio: 15 (ref 9–20)
BUN: 17 mg/dL (ref 6–24)
Bilirubin Total: 0.5 mg/dL (ref 0.0–1.2)
CO2: 25 mmol/L (ref 20–29)
Calcium: 9.6 mg/dL (ref 8.7–10.2)
Chloride: 103 mmol/L (ref 96–106)
Creatinine, Ser: 1.1 mg/dL (ref 0.76–1.27)
GFR calc Af Amer: 92 mL/min/{1.73_m2} (ref >59)
GFR calc non Af Amer: 80 mL/min/{1.73_m2} (ref >59)
Globulin, Total: 2.8 g/dL (ref 1.5–4.5)
Glucose: 73 mg/dL (ref 65–99)
Potassium: 3.9 mmol/L (ref 3.5–5.2)
Sodium: 145 mmol/L — ABNORMAL HIGH (ref 134–144)
Total Protein: 7.5 g/dL (ref 6.0–8.5)

## 2019-01-14 LAB — LIPID PANEL
Chol/HDL Ratio: 2.8 ratio (ref 0.0–5.0)
Cholesterol, Total: 233 mg/dL — ABNORMAL HIGH (ref 100–199)
HDL: 83 mg/dL (ref >39)
LDL Calculated: 144 mg/dL — ABNORMAL HIGH (ref 0–99)
Triglycerides: 29 mg/dL (ref 0–149)
VLDL Cholesterol Cal: 6 mg/dL (ref 5–40)

## 2019-01-14 LAB — PSA: Prostate Specific Ag, Serum: 1 ng/mL (ref 0.0–4.0)

## 2019-01-14 LAB — HIV ANTIBODY (ROUTINE TESTING W REFLEX): HIV Screen 4th Generation wRfx: NONREACTIVE

## 2019-01-28 ENCOUNTER — Other Ambulatory Visit: Payer: Self-pay | Admitting: Family Medicine

## 2019-02-06 ENCOUNTER — Telehealth: Payer: Self-pay

## 2019-02-06 NOTE — Telephone Encounter (Signed)
COVID-19 Pre-Screening Questions:  Provider: 02-08-2019 LAWRENCE/[place]

## 2019-02-07 NOTE — Telephone Encounter (Signed)
[email or web address]  

## 2019-02-07 NOTE — Telephone Encounter (Signed)
[email or web address] 

## 2019-02-08 ENCOUNTER — Ambulatory Visit: Payer: BLUE CROSS/BLUE SHIELD | Admitting: Adult Health

## 2019-02-08 NOTE — Telephone Encounter (Signed)
  Cancel Rsn: Patient (COVID-19, patient said he will c/b to r/s )

## 2019-05-24 ENCOUNTER — Telehealth: Payer: Self-pay | Admitting: *Deleted

## 2019-05-24 NOTE — Telephone Encounter (Signed)
A message was left, re: follow up visit. 

## 2019-07-11 ENCOUNTER — Ambulatory Visit: Payer: 59 | Admitting: Nurse Practitioner

## 2019-07-14 ENCOUNTER — Ambulatory Visit: Payer: BLUE CROSS/BLUE SHIELD | Admitting: Nurse Practitioner

## 2019-07-18 ENCOUNTER — Telehealth: Payer: Self-pay

## 2019-07-18 NOTE — Telephone Encounter (Signed)
LVM for pt to call office to have his missed appt rescheduled

## 2020-01-19 ENCOUNTER — Encounter: Payer: BLUE CROSS/BLUE SHIELD | Admitting: Nurse Practitioner

## 2020-02-27 ENCOUNTER — Telehealth (INDEPENDENT_AMBULATORY_CARE_PROVIDER_SITE_OTHER): Payer: 59 | Admitting: Cardiology

## 2020-02-27 ENCOUNTER — Encounter: Payer: Self-pay | Admitting: Cardiology

## 2020-02-27 DIAGNOSIS — Z8679 Personal history of other diseases of the circulatory system: Secondary | ICD-10-CM | POA: Diagnosis not present

## 2020-02-27 DIAGNOSIS — R079 Chest pain, unspecified: Secondary | ICD-10-CM | POA: Diagnosis not present

## 2020-02-27 DIAGNOSIS — E785 Hyperlipidemia, unspecified: Secondary | ICD-10-CM

## 2020-02-27 NOTE — Patient Instructions (Signed)

## 2020-02-27 NOTE — Progress Notes (Signed)
Virtual Visit via Video Note   This visit type was conducted due to national recommendations for restrictions regarding the COVID-19 Pandemic (e.g. social distancing) in an effort to limit this patient's exposure and mitigate transmission in our community.  Due to his co-morbid illnesses, this patient is at least at moderate risk for complications without adequate follow up.  This format is felt to be most appropriate for this patient at this time.  All issues noted in this document were discussed and addressed.  A limited physical exam was performed with this format.  Please refer to the patient's chart for his consent to telehealth for [facility]   The patient was identified using 2 identifiers.  Date:  02/27/2020   ID:  [name] [name], DOB [birth year], MRN [id number]  Patient Location: Home Provider Location: Home  PCP:  Arnette Felts, FNP  Cardiologist:  Olga Millers, MD  Electrophysiologist:  None   Evaluation Performed:  Follow-Up Visit  Chief Complaint:  none  History of Present Illness:    [name] [name] is a 49 y.o. male with a history of pericarditis in 2005.  He has had chest pain off and on since.  He had a negative stress test in August 2019.  In February 2020 he had some chest pain and an echocardiogram showed normal LV function with no pericardial effusion.  He was contacted today for follow-up.  Since we saw [name] [name] last he has been doing well.  He denies any chest pain.  He is trying to eat better and has managed to lose about 10 pounds.  His primary care provider is following up his lipids, his LDL was 144 in February 2020.  The patient did have a recent exposure to someone with Covid.  The patient had his rapid test yesterday which was negative, he was advised to get another one later this week.  His only symptom is a mild headache.  The patient does not have symptoms concerning for COVID-19 infection (fever, chills, cough, or new shortness of  breath).    Past Medical History:  Diagnosis Date  . GERD   . LYMPHADENOPATHY   . MIGRAINE HEADACHE   . PERICARDITIS, ACUTE   . POSITIVE PPD    Past Surgical History:  Procedure Laterality Date  . hand fracture repair    . HERNIA REPAIR       Current Meds  Medication Sig  . acetaminophen (TYLENOL) 500 MG tablet Take 500 mg by mouth every 6 (six) hours as needed. Pain  . colchicine 0.6 MG tablet Take 1 tablet (0.6 mg total) by mouth daily.  Marland Kitchen esomeprazole (NEXIUM) 40 MG capsule Take 40 mg by mouth daily as needed.   . Multiple Vitamins-Minerals (MULTIVITAMIN WITH MINERALS) tablet Take 1 tablet by mouth daily.     Allergies:   Patient has no known allergies.   Social History   Tobacco Use  . Smoking status: Never Smoker  . Smokeless tobacco: Never Used  Substance Use Topics  . Alcohol use: Yes    Comment: rarely  . Drug use: No     Family Hx: The patient's family history includes Hypertension in his mother; Kidney disease in his father.  ROS:   Please see the history of present illness.    All other systems reviewed and are negative.   Prior CV studies:   The following studies were reviewed today: Myoview 2019 Echo feb 2020  Labs/Other Tests and Data Reviewed:    EKG:  An  ECG dated 12/31/2019 was personally reviewed today and demonstrated:  NSR-HR 61- inferior lateral TWI  Recent Labs: No results found for requested labs within last 8760 hours.   Recent Lipid Panel Lab Results  Component Value Date/Time   CHOL 233 (H) 01/13/2019 10:42 AM   TRIG 29 01/13/2019 10:42 AM   HDL 83 01/13/2019 10:42 AM   CHOLHDL 2.8 01/13/2019 10:42 AM   LDLCALC 144 (H) 01/13/2019 10:42 AM    Wt Readings from Last 3 Encounters:  02/27/20 180 lb 3.2 oz (81.7 kg)  01/13/19 185 lb 6.4 oz (84.1 kg)  01/06/19 185 lb (83.9 kg)     Objective:    Vital Signs:  Ht 5\' 11"  (1.803 m)   Wt 180 lb 3.2 oz (81.7 kg)   BMI 25.13 kg/m    VITAL SIGNS:  reviewed  ASSESSMENT &  PLAN:    H/O pericarditis- 2005- echo Feb 2020- normal LVF, no pericardial effusion  H/O chest pain- Low risk Myoview Aug 2019- no current complaints of chest pain  Possible COVID exposure- Rapid test pending, minimal symptoms, PCP will f/u  Plan:  F/U in one year in the office.  COVID-19 Education: The signs and symptoms of COVID-19 were discussed with the patient and how to seek care for testing (follow up with PCP or arrange E-visit).  The importance of social distancing was discussed today.  Time:   Today, I have spent 15 minutes with the patient with telehealth technology discussing the above problems.     Medication Adjustments/Labs and Tests Ordered: Current medicines are reviewed at length with the patient today.  Concerns regarding medicines are outlined above.   Tests Ordered: No orders of the defined types were placed in this encounter.   Medication Changes: No orders of the defined types were placed in this encounter.   Follow Up:  In Person Dr Stanford Breed in one year  Signed, Kerin Ransom, Hershal Coria  02/27/2020 2:49 PM    [place]

## 2020-09-14 DIAGNOSIS — Z20822 Contact with and (suspected) exposure to covid-19: Secondary | ICD-10-CM | POA: Diagnosis not present

## 2020-09-14 DIAGNOSIS — J069 Acute upper respiratory infection, unspecified: Secondary | ICD-10-CM | POA: Diagnosis not present

## 2020-10-02 ENCOUNTER — Other Ambulatory Visit: Payer: Self-pay

## 2020-10-02 ENCOUNTER — Ambulatory Visit (INDEPENDENT_AMBULATORY_CARE_PROVIDER_SITE_OTHER): Payer: BC Managed Care – PPO | Admitting: Nurse Practitioner

## 2020-10-02 ENCOUNTER — Encounter: Payer: Self-pay | Admitting: Nurse Practitioner

## 2020-10-02 VITALS — BP 128/84 | HR 73 | Temp 98.0°F | Ht 71.6 in | Wt 176.4 lb

## 2020-10-02 DIAGNOSIS — E559 Vitamin D deficiency, unspecified: Secondary | ICD-10-CM

## 2020-10-02 DIAGNOSIS — Z125 Encounter for screening for malignant neoplasm of prostate: Secondary | ICD-10-CM | POA: Diagnosis not present

## 2020-10-02 DIAGNOSIS — Z0000 Encounter for general adult medical examination without abnormal findings: Secondary | ICD-10-CM

## 2020-10-02 DIAGNOSIS — Z23 Encounter for immunization: Secondary | ICD-10-CM

## 2020-10-02 DIAGNOSIS — R053 Chronic cough: Secondary | ICD-10-CM | POA: Diagnosis not present

## 2020-10-02 DIAGNOSIS — Z1159 Encounter for screening for other viral diseases: Secondary | ICD-10-CM

## 2020-10-02 DIAGNOSIS — E785 Hyperlipidemia, unspecified: Secondary | ICD-10-CM

## 2020-10-02 MED ORDER — PREDNISONE 10 MG (21) PO TBPK: ORAL_TABLET | ORAL | 0 refills | Status: DC

## 2020-10-02 NOTE — Patient Instructions (Signed)

## 2020-10-02 NOTE — Progress Notes (Signed)
I,Yamilka Roman [employer] as a Education administrator for [employer], FNP.,have documented all relevant documentation on the behalf of [name], FNP,as directed by  [name], FNP while in the presence of [name], [name]. This visit occurred during the SARS-CoV-2 public health emergency.  Safety protocols were in place, including screening questions prior to the visit, additional usage of staff PPE, and extensive cleaning of exam room while observing appropriate contact time as indicated for disinfecting solutions.  Subjective:     Patient ID: [name] [name] , male    DOB: Oct 03, 1971 , 49 y.o.   MRN: [id number]   Chief Complaint  Patient presents with  . Annual Exam    HPI   here for HM.  He has not been eating red meat.  In October he had been sick with a cough and was negative for covid treated with tessalon perles and cough syrup. He continues to have cough when he lies down with secretions with specks (brownish/red).   Wt Readings from Last 3 Encounters:  10/02/20 176 lb 6.4 oz (80 kg)  02/27/20 180 lb 3.2 oz (81.7 kg)  01/13/19 185 lb 6.4 oz (84.1 kg)     Past Medical History:  Diagnosis Date  . GERD   . LYMPHADENOPATHY   . MIGRAINE HEADACHE   . PERICARDITIS, ACUTE   . POSITIVE PPD      Family History  Problem Relation Age of Onset  . Hypertension Mother   . Kidney disease Father      Current Outpatient Medications:  .  acetaminophen (TYLENOL) 500 MG tablet, Take 500 mg by mouth every 6 (six) hours as needed. Pain, Disp: , Rfl:  .  Multiple Vitamins-Minerals (MULTIVITAMIN WITH MINERALS) tablet, Take 1 tablet by mouth daily., Disp: , Rfl:  .  VITAMIN D PO, Take by mouth. Take one tablet daily, Disp: , Rfl:  .  colchicine 0.6 MG tablet, Take 1 tablet (0.6 mg total) by mouth daily. (Patient not taking: Reported on 10/02/2020), Disp: 30 tablet, Rfl: 6 .  esomeprazole (NEXIUM) 40 MG capsule, Take 40 mg by mouth daily as needed.  (Patient not taking: Reported on  10/02/2020), Disp: , Rfl:  .  predniSONE (STERAPRED UNI-PAK 21 TAB) 10 MG (21) TBPK tablet, Take as directed, Disp: 21 tablet, Rfl: 0   No Known Allergies   Men's preventive visit. Patient Health Questionnaire (PHQ-2) is    Office Visit from 10/02/2020 in [facility]  PHQ-2 Total Score 0     Patient is on a regular diet, he is no longer eating red meat.  He is exercising 3 times a week. Marital status: Single. Relevant history for alcohol use is:  Social History   Substance and Sexual Activity  Alcohol Use Yes   Comment: rarely   Relevant history for tobacco use is:  Social History   Tobacco Use  Smoking Status Never Smoker  Smokeless Tobacco Never Used  .   Review of Systems  Constitutional: Negative.   HENT: Negative.   Eyes: Negative.   Respiratory: Negative.   Cardiovascular: Negative.   Endocrine: Negative.   Genitourinary: Negative.   Musculoskeletal: Negative.   Skin: Negative.   Neurological: Negative.   Hematological: Negative.   Psychiatric/Behavioral: Negative.      Today's Vitals   10/02/20 1143  BP: 128/84  Pulse: 73  Temp: 98 F (36.7 C)  Weight: 176 lb 6.4 oz (80 kg)  Height: 5' 11.6" (1.819 m)  PainSc: 0-No pain   Body  mass index is 24.19 kg/m.   Objective:  Physical Exam Vitals reviewed.  Constitutional:      Appearance: Normal appearance. He is obese.  HENT:     Head: Normocephalic and atraumatic.     Right Ear: Tympanic membrane, ear canal and external ear normal. There is no impacted cerumen.     Left Ear: Tympanic membrane, ear canal and external ear normal. There is no impacted cerumen.  Cardiovascular:     Rate and Rhythm: Normal rate and regular rhythm.     Pulses: Normal pulses.     Heart sounds: Normal heart sounds. No murmur heard.   Pulmonary:     Effort: Pulmonary effort is normal. No respiratory distress.     Breath sounds: Normal breath sounds.  Abdominal:     General: Abdomen is flat. Bowel  sounds are normal. There is no distension.     Palpations: Abdomen is soft.  Genitourinary:    Prostate: Normal.     Rectum: Guaiac result negative.  Musculoskeletal:        General: Normal range of motion.     Cervical back: Normal range of motion and neck supple.  Skin:    General: Skin is warm.     Capillary Refill: Capillary refill takes less than 2 seconds.  Neurological:     General: No focal deficit present.     Mental Status: He is alert and oriented to person, place, and time.  Psychiatric:        Mood and Affect: Mood normal.        Behavior: Behavior normal.        Thought Content: Thought content normal.        Judgment: Judgment normal.         Assessment And Plan:    1. Encounter for general adult medical examination w/o abnormal findings . Behavior modifications discussed and diet history reviewed.   . Pt will continue to exercise regularly and modify diet with low GI, plant based foods and decrease intake of processed foods.  . Recommend intake of daily multivitamin, Vitamin D, and calcium.  . Recommend for preventive screenings, as well as recommend immunizations that include influenza, TDAP (up to date) - CBC no Diff  2. Hyperlipidemia, unspecified hyperlipidemia type  Chronic, diet controlled  Will check lipid panel - CMP14+EGFR - Lipid panel  3. Vitamin D deficiency  Will check vitamin D level and supplement as needed.     Also encouraged to spend 15 minutes in the sun daily.  - Vitamin D (25 hydroxy)  4. Persistent cough for 3 weeks or longer  He has tested negative for covid but has had a persistent hacking cough likely related to bronchitis  Will treat with prednisone if not better return call to office may need a CXR - predniSONE (STERAPRED UNI-PAK 21 TAB) 10 MG (21) TBPK tablet; Take as directed  Dispense: 21 tablet; Refill: 0  5. Encounter for hepatitis C screening test for low risk patient  Will check Hepatitis C screening due to  recent recommendations to screen all adults 18 years and older - Hepatitis C antibody  6. Need for influenza vaccination  Influenza vaccine administered  Encouraged to take Tylenol as needed for fever or muscle aches. - Flu Vaccine QUAD 6+ mos PF IM (Fluarix Quad PF)  7. Encounter for prostate cancer screening - PSA    Patient was given opportunity to ask questions. Patient verbalized understanding of the plan and was able to repeat  key elements of the plan. All questions were answered to their satisfaction.    Teola Bradley, FNP, have reviewed all documentation for this visit. The documentation on 10/15/20 for the exam, diagnosis, procedures, and orders are all accurate and complete.  THE PATIENT IS ENCOURAGED TO PRACTICE SOCIAL DISTANCING DUE TO THE COVID-19 PANDEMIC.

## 2020-10-03 LAB — PSA: Prostate Specific Ag, Serum: 0.9 ng/mL (ref 0.0–4.0)

## 2020-10-03 LAB — CMP14+EGFR
ALT: 12 IU/L (ref 0–44)
AST: 17 IU/L (ref 0–40)
Albumin/Globulin Ratio: 1.8 (ref 1.2–2.2)
Albumin: 4.2 g/dL (ref 4.0–5.0)
Alkaline Phosphatase: 55 IU/L (ref 44–121)
BUN/Creatinine Ratio: 14 (ref 9–20)
BUN: 12 mg/dL (ref 6–24)
Bilirubin Total: 0.3 mg/dL (ref 0.0–1.2)
CO2: 25 mmol/L (ref 20–29)
Calcium: 8.8 mg/dL (ref 8.7–10.2)
Chloride: 105 mmol/L (ref 96–106)
Creatinine, Ser: 0.83 mg/dL (ref 0.76–1.27)
GFR calc Af Amer: 119 mL/min/{1.73_m2} (ref >59)
GFR calc non Af Amer: 103 mL/min/{1.73_m2} (ref >59)
Globulin, Total: 2.4 g/dL (ref 1.5–4.5)
Glucose: 76 mg/dL (ref 65–99)
Potassium: 3.9 mmol/L (ref 3.5–5.2)
Sodium: 141 mmol/L (ref 134–144)
Total Protein: 6.6 g/dL (ref 6.0–8.5)

## 2020-10-03 LAB — CBC
Hematocrit: 44.2 % (ref 37.5–51.0)
Hemoglobin: 14.9 g/dL (ref 13.0–17.7)
MCH: 31.3 pg (ref 26.6–33.0)
MCHC: 33.7 g/dL (ref 31.5–35.7)
MCV: 93 fL (ref 79–97)
Platelets: 234 10*3/uL (ref 150–450)
RBC: 4.76 x10E6/uL (ref 4.14–5.80)
RDW: 12.7 % (ref 11.6–15.4)
WBC: 3.5 10*3/uL (ref 3.4–10.8)

## 2020-10-03 LAB — LIPID PANEL
Chol/HDL Ratio: 2.6 ratio (ref 0.0–5.0)
Cholesterol, Total: 157 mg/dL (ref 100–199)
HDL: 61 mg/dL (ref >39)
LDL Chol Calc (NIH): 89 mg/dL (ref 0–99)
Triglycerides: 30 mg/dL (ref 0–149)
VLDL Cholesterol Cal: 7 mg/dL (ref 5–40)

## 2020-10-03 LAB — VITAMIN D 25 HYDROXY (VIT D DEFICIENCY, FRACTURES): Vit D, 25-Hydroxy: 30.7 ng/mL (ref 30.0–100.0)

## 2020-10-03 LAB — HEPATITIS C ANTIBODY: Hep C Virus Ab: <0.1 s/co ratio (ref 0.0–0.9)

## 2020-10-04 ENCOUNTER — Telehealth: Payer: Self-pay

## 2020-10-04 NOTE — Telephone Encounter (Signed)
Patient returned call and was notified of lab results. YL,RMA

## 2020-10-15 DIAGNOSIS — E559 Vitamin D deficiency, unspecified: Secondary | ICD-10-CM | POA: Insufficient documentation

## 2021-06-22 NOTE — Progress Notes (Deleted)
Cardiology Office Note:    Date:  06/22/2021   ID:  [name] [name], DOB 1971/05/09, MRN [id number]  PCP:  Arnette Felts, FNP Referring MD: Arnette Felts, FNP    [facility]  Cardiologist:  Olga Millers, MD   Reason for visit: ***  History of Present Illness:    [name] [name] is a 50 y.o. male with a hx of pericarditis in 2005 with subsequent reoccurring chest pain.  Negative stress test in August 2019.  In February 2020, he had some chest pain and an echocardiogram showed normal LV function with no pericardial effusion.  His last visit with [place] was in April 2021 with Corine Shelter, PA-C.  He was doing well and was recommended to follow-up with [place] in 1 year.  History of pericarditis  - pericarditis in 2005 - echo Feb 2020- normal LVF, no pericardial effusion     ASCVD risk score: ***  Prior CV studies: EXERCISE TOLERANCE TEST (ETT) 06/17/2018 Narrative  Resting EKG showed normal sinus rhythm with early repolarization of normality.  The patient walks on a Bruce protocol treadmill test for 11 minutes and 1 second. He achieved a peak heart rate of 162 which is 93% predicted maximal heart rate.  At peak exercise there were no ST or T wave changes to suggest ischemia. There were no arrhythmias. The blood pressure response to exercise was normal.  This is interpreted as a negative stress test. There is no evidence of ischemia.   Past Medical History:  Diagnosis Date   GERD    LYMPHADENOPATHY    MIGRAINE HEADACHE    PERICARDITIS, ACUTE    POSITIVE PPD     Past Surgical History:  Procedure Laterality Date   hand fracture repair     HERNIA REPAIR      Current Medications: No outpatient medications have been marked as taking for the 06/23/21 encounter (Appointment) with [name] Kettle, PA-C.     Allergies:   Patient has no known allergies.   Social History   Socioeconomic History   Marital status: Single    Spouse name: Not  on file   Number of children: Not on file   Years of education: Not on file   Highest education level: Not on file  Occupational History   Not on file  Tobacco Use   Smoking status: Never   Smokeless tobacco: Never  Vaping Use   Vaping Use: Never used  Substance and Sexual Activity   Alcohol use: Yes    Comment: rarely   Drug use: No   Sexual activity: Not on file  Other Topics Concern   Not on file  Social History Narrative   Not on file   Social Determinants of Health   Financial Resource Strain: Not on file  Food Insecurity: Not on file  Transportation Needs: Not on file  Physical Activity: Not on file  Stress: Not on file  Social Connections: Not on file     Family History: The patient's family history includes Hypertension in his mother; Kidney disease in his father.  ROS:   Please see the history of present illness.     EKGs/Labs/Other Studies Reviewed:    EKG:  The ekg ordered today demonstrates ***  Recent Labs: 10/02/2020: ALT 12; BUN 12; Creatinine, Ser 0.83; Hemoglobin 14.9; Platelets 234; Potassium 3.9; Sodium 141  Recent Lipid Panel    Component Value Date/Time   CHOL 157 10/02/2020 1245   TRIG 30 10/02/2020 1245  HDL 61 10/02/2020 1245   CHOLHDL 2.6 10/02/2020 1245   LDLCALC 89 10/02/2020 1245    Physical Exam:    VS:  There were no vitals taken for this visit.    Wt Readings from Last 3 Encounters:  10/02/20 176 lb 6.4 oz (80 kg)  02/27/20 180 lb 3.2 oz (81.7 kg)  01/13/19 185 lb 6.4 oz (84.1 kg)     GEN: *** Well nourished, well developed in no acute distress HEENT: Normal NECK: No JVD; No carotid bruits CARDIAC: ***RRR, no murmurs, rubs, gallops RESPIRATORY:  Clear to auscultation without rales, wheezing or rhonchi  ABDOMEN: Soft, non-tender, non-distended MUSCULOSKELETAL: No edema; No deformity  SKIN: Warm and dry NEUROLOGIC:  Alert and oriented PSYCHIATRIC:  Normal affect   ASSESSMENT AND PLAN   ***   {Are you ordering  a CV Procedure (e.g. stress test, cath, DCCV, TEE, etc)?   Press F2        :[id number]}    Medication Adjustments/Labs and Tests Ordered: Current medicines are reviewed at length with the patient today.  Concerns regarding medicines are outlined above.  No orders of the defined types were placed in this encounter.  No orders of the defined types were placed in this encounter.   There are no Patient Instructions on file for this visit.   Signed, [name] Kettle, PA-C  06/22/2021 9:37 PM    [facility]

## 2021-06-23 ENCOUNTER — Ambulatory Visit: Payer: 59 | Admitting: Physician Assistant

## 2021-06-23 DIAGNOSIS — Z8679 Personal history of other diseases of the circulatory system: Secondary | ICD-10-CM

## 2021-06-26 ENCOUNTER — Emergency Department
Admission: EM | Admit: 2021-06-26 | Discharge: 2021-06-26 | Disposition: A | Discharge status: Home / Self Care | Payer: BC Managed Care – PPO | Attending: Emergency Medicine | Admitting: Emergency Medicine

## 2021-06-26 ENCOUNTER — Other Ambulatory Visit: Payer: Self-pay

## 2021-06-26 DIAGNOSIS — R531 Weakness: Secondary | ICD-10-CM | POA: Diagnosis not present

## 2021-06-26 DIAGNOSIS — R55 Syncope and collapse: Secondary | ICD-10-CM | POA: Diagnosis not present

## 2021-06-26 DIAGNOSIS — R059 Cough, unspecified: Secondary | ICD-10-CM | POA: Diagnosis not present

## 2021-06-26 DIAGNOSIS — R9431 Abnormal electrocardiogram [ECG] [EKG]: Secondary | ICD-10-CM | POA: Diagnosis not present

## 2021-06-26 DIAGNOSIS — Z5321 Procedure and treatment not carried out due to patient leaving prior to being seen by health care provider: Secondary | ICD-10-CM | POA: Insufficient documentation

## 2021-06-26 DIAGNOSIS — U071 COVID-19: Secondary | ICD-10-CM | POA: Diagnosis not present

## 2021-06-26 DIAGNOSIS — R42 Dizziness and giddiness: Secondary | ICD-10-CM | POA: Diagnosis not present

## 2021-06-26 DIAGNOSIS — R52 Pain, unspecified: Secondary | ICD-10-CM | POA: Diagnosis not present

## 2021-06-26 DIAGNOSIS — J029 Acute pharyngitis, unspecified: Secondary | ICD-10-CM | POA: Diagnosis not present

## 2021-06-26 DIAGNOSIS — R509 Fever, unspecified: Secondary | ICD-10-CM | POA: Diagnosis not present

## 2021-06-26 LAB — CBC
HCT: 40.4 % (ref 39.0–52.0)
Hemoglobin: 14.5 g/dL (ref 13.0–17.0)
MCH: 32.4 pg (ref 26.0–34.0)
MCHC: 35.9 g/dL (ref 30.0–36.0)
MCV: 90.2 fL (ref 80.0–100.0)
Platelets: 174 10*3/uL (ref 150–400)
RBC: 4.48 MIL/uL (ref 4.22–5.81)
RDW: 12 % (ref 11.5–15.5)
WBC: 4.9 10*3/uL (ref 4.0–10.5)
nRBC: 0 % (ref 0.0–0.2)

## 2021-06-26 LAB — URINALYSIS, COMPLETE (UACMP) WITH MICROSCOPIC
Bilirubin Urine: NEGATIVE
Glucose, UA: NEGATIVE mg/dL
Hgb urine dipstick: NEGATIVE
Ketones, ur: 5 mg/dL — AB
Leukocytes,Ua: NEGATIVE
Nitrite: NEGATIVE
Protein, ur: NEGATIVE mg/dL
Specific Gravity, Urine: 1.018 (ref 1.005–1.030)
Squamous Epithelial / LPF: NONE SEEN (ref 0–5)
pH: 8 (ref 5.0–8.0)

## 2021-06-26 LAB — BASIC METABOLIC PANEL
Anion gap: 7 (ref 5–15)
BUN: 12 mg/dL (ref 6–20)
CO2: 25 mmol/L (ref 22–32)
Calcium: 8.8 mg/dL — ABNORMAL LOW (ref 8.9–10.3)
Chloride: 102 mmol/L (ref 98–111)
Creatinine, Ser: 0.94 mg/dL (ref 0.61–1.24)
GFR, Estimated: >60 mL/min (ref >60)
Glucose, Bld: 99 mg/dL (ref 70–99)
Potassium: 3.5 mmol/L (ref 3.5–5.1)
Sodium: 134 mmol/L — ABNORMAL LOW (ref 135–145)

## 2021-06-26 NOTE — ED Triage Notes (Signed)
Pt comes via EMs from home with c/o dizziness. Pt states he was getting out of shower when this happened. Pt states leg pain and arm 8/10.  99.8 149/91 73 95%RA 106-CBG

## 2021-06-27 ENCOUNTER — Telehealth: Payer: Self-pay

## 2021-06-27 NOTE — Telephone Encounter (Signed)
Called pt attempting to sch ed f/u. Vm left for pt to call back.

## 2021-06-30 ENCOUNTER — Telehealth: Payer: Self-pay

## 2021-06-30 DIAGNOSIS — Z20822 Contact with and (suspected) exposure to covid-19: Secondary | ICD-10-CM | POA: Diagnosis not present

## 2021-06-30 NOTE — Telephone Encounter (Signed)
Called to sch pt a ed f/u. Left vm for pt to give a call back.

## 2021-08-12 NOTE — Progress Notes (Deleted)
Cardiology Office Note:    Date:  08/12/2021   ID:  TASHAUN OBEY, DOB Mar 28, 1971, MRN [id number]  PCP:  Arnette Felts, FNP [facility] Cardiologist: Olga Millers, MD   Reason for visit: Follow-up visit?  History of Present Illness:    [name] [name] is a 50 y.o. male with a hx of pericarditis in 2005 with intermittent chest pain following, negative stress test in August 2019, normal LV and no pericardial effusion on echo 2020.  He last saw [name] [name], [place] in April 2021 and was doing well.  Admitted to [facility] June 26, 2021 with presyncope, sore throat and cough.  He was found to have COVID 19.  Troponins negative.  H/O pericarditis in 2005 - echo Feb 2020- normal LVF, no pericardial effusion   H/O chest pain- - Low risk Myoview Aug 2019  ? Referral to Renaee Munda, care guide for health goals, tobacco cessation, nutrition, stress management and sleep hygiene. ? Referral to P.R.E.P. Evlyn Clines, the [employer] and will begin in [place] in October. The PREP program is a 12 week exercise and lifestyle management program led by an RN who works out the [employer].  This RN is employed by [facility]- however ALL patients are welcome in this program.  [facility] will cover the $100 cost of the program for [facility] patients (we will also find money for non-[facility] patients when necessary).  If you have a patient that needs exercise, nutrition, lifestyle change or just socialization- PLEASE REFER!  Easiest way to refer is to email  [email or web address] or call any of the numbers on the flyer  Hypertension -*** -Goal BP is <130/80.  Recommend DASH diet (high in vegetables, fruits, low-fat dairy products, whole grains, poultry, fish, and nuts and low in sweets, sugar-sweetened beverages, and red meats), salt restriction and increase physical activity.  Hyperlipidemia -*** -Discussed cholesterol lowering diets - Mediterranean diet, DASH diet, vegetarian diet, low-carbohydrate diet and  avoidance of trans fats.  Discussed healthier choice substitutes.  Nuts, high-fiber foods, and fiber supplements may also improve lipids.    Obesity -Discussed how even a 5-10% weight loss can have cardiovascular benefits.   -Recommend moderate intensity activity for 30 minutes 5 days/week and the DASH diet.  Tobacco use  -Recommend tobacco cessation.  Reviewed physiologic effects of nicotine and the immediate-eventual benefits of quitting including improvement in cough/breathing and reduction in cardiovascular events.  Discussed quitting tips such as removing triggers and getting support from family/friends and Quitline [place]. -USPSTF recommends one-time screening for abdominal aortic aneurysm (AAA) by ultrasound in men 73 -32 years old who have ever smoked.       Disposition - Follow-up in ***    Past Medical History:  Diagnosis Date   GERD    LYMPHADENOPATHY    MIGRAINE HEADACHE    PERICARDITIS, ACUTE    POSITIVE PPD     Past Surgical History:  Procedure Laterality Date   hand fracture repair     HERNIA REPAIR      Current Medications: No outpatient medications have been marked as taking for the 08/14/21 encounter (Appointment) with Cannon Kettle, PA-C.     Allergies:   Patient has no known allergies.   Social History   Socioeconomic History   Marital status: Single    Spouse name: Not on file   Number of children: Not on file   Years of education: Not on file   Highest education level: Not on file  Occupational History  Not on file  Tobacco Use   Smoking status: Never   Smokeless tobacco: Never  Vaping Use   Vaping Use: Never used  Substance and Sexual Activity   Alcohol use: Yes    Comment: rarely   Drug use: No   Sexual activity: Not on file  Other Topics Concern   Not on file  Social History Narrative   Not on file   Social Determinants of Health   Financial Resource Strain: Not on file  Food Insecurity: Not on file  Transportation Needs: Not  on file  Physical Activity: Not on file  Stress: Not on file  Social Connections: Not on file     Family History: The patient's family history includes Hypertension in his mother; Kidney disease in his father.  ROS:   Please see the history of present illness.     EKGs/Labs/Other Studies Reviewed:    EKG:  The ekg ordered today demonstrates ***  Recent Labs: 10/02/2020: ALT 12 06/26/2021: BUN 12; Creatinine, Ser 0.94; Hemoglobin 14.5; Platelets 174; Potassium 3.5; Sodium 134  Recent Lipid Panel    Component Value Date/Time   CHOL 157 10/02/2020 1245   TRIG 30 10/02/2020 1245   HDL 61 10/02/2020 1245   CHOLHDL 2.6 10/02/2020 1245   LDLCALC 89 10/02/2020 1245    Physical Exam:    VS:  There were no vitals taken for this visit.   No data found.  Wt Readings from Last 3 Encounters:  10/02/20 176 lb 6.4 oz (80 kg)  02/27/20 180 lb 3.2 oz (81.7 kg)  01/13/19 185 lb 6.4 oz (84.1 kg)     GEN: *** Well nourished, well developed in no acute distress HEENT: Normal NECK: No JVD; No carotid bruits CARDIAC: ***RRR, no murmurs, rubs, gallops RESPIRATORY:  Clear to auscultation without rales, wheezing or rhonchi  ABDOMEN: Soft, non-tender, non-distended MUSCULOSKELETAL: No edema; No deformity  SKIN: Warm and dry NEUROLOGIC:  Alert and oriented PSYCHIATRIC:  Normal affect   ASSESSMENT AND PLAN   ***   {Are you ordering a CV Procedure (e.g. stress test, cath, DCCV, TEE, etc)?   Press F2        :[id number]}    Medication Adjustments/Labs and Tests Ordered: Current medicines are reviewed at length with the patient today.  Concerns regarding medicines are outlined above.  No orders of the defined types were placed in this encounter.  No orders of the defined types were placed in this encounter.   There are no Patient Instructions on file for this visit.   Signed, Cannon Kettle, PA-C  08/12/2021 1:29 PM    [facility]

## 2021-08-14 ENCOUNTER — Ambulatory Visit: Payer: BC Managed Care – PPO | Admitting: Physician Assistant

## 2021-08-14 DIAGNOSIS — Z8679 Personal history of other diseases of the circulatory system: Secondary | ICD-10-CM

## 2021-08-28 DIAGNOSIS — R55 Syncope and collapse: Secondary | ICD-10-CM | POA: Diagnosis not present

## 2021-10-06 ENCOUNTER — Encounter: Payer: BC Managed Care – PPO | Admitting: Nurse Practitioner

## 2022-02-04 ENCOUNTER — Encounter: Payer: Self-pay | Admitting: Nurse Practitioner

## 2022-02-04 ENCOUNTER — Ambulatory Visit: Payer: BC Managed Care – PPO | Admitting: Nurse Practitioner

## 2022-02-04 ENCOUNTER — Other Ambulatory Visit: Payer: Self-pay

## 2022-02-04 VITALS — BP 120/64 | HR 63 | Temp 98.3°F | Ht 71.6 in | Wt 187.0 lb

## 2022-02-04 DIAGNOSIS — Z23 Encounter for immunization: Secondary | ICD-10-CM | POA: Diagnosis not present

## 2022-02-04 DIAGNOSIS — E785 Hyperlipidemia, unspecified: Secondary | ICD-10-CM | POA: Diagnosis not present

## 2022-02-04 DIAGNOSIS — Z8616 Personal history of COVID-19: Secondary | ICD-10-CM

## 2022-02-04 DIAGNOSIS — Z1211 Encounter for screening for malignant neoplasm of colon: Secondary | ICD-10-CM

## 2022-02-04 NOTE — Progress Notes (Signed)
?Clinical biochemist as a Neurosurgeon for [employer], FNP.,have documented all relevant documentation on the behalf of Arnette Felts, FNP,as directed by  Arnette Felts, FNP while in the presence of Arnette Felts, FNP. ? ?This visit occurred during the SARS-CoV-2 public health emergency.  Safety protocols were in place, including screening questions prior to the visit, additional usage of staff PPE, and extensive cleaning of exam room while observing appropriate contact time as indicated for disinfecting solutions. ? ?Subjective:  ?  ? Patient ID: [name] [name] , male    DOB: January 17, 1971 , 51 y.o.   MRN: [id number] ? ? ?Chief Complaint  ?Patient presents with  ? Referral  ? ? ?HPI ? ?Patient presents today for a referral for colonoscopy.  Reports having Covid in August and doing well.  ?  ? ?Past Medical History:  ?Diagnosis Date  ? GERD   ? LYMPHADENOPATHY   ? MIGRAINE HEADACHE   ? PERICARDITIS, ACUTE   ? POSITIVE PPD   ?  ? ?Family History  ?Problem Relation Age of Onset  ? Hypertension Mother   ? Kidney disease Father   ? ? ? ?Current Outpatient Medications:  ?  esomeprazole (NEXIUM) 40 MG capsule, Take 40 mg by mouth daily as needed., Disp: , Rfl:  ?  Multiple Vitamins-Minerals (MULTIVITAMIN WITH MINERALS) tablet, Take 1 tablet by mouth daily., Disp: , Rfl:  ?  VITAMIN D PO, Take by mouth. Take one tablet daily, Disp: , Rfl:   ? ?No Known Allergies  ? ?Review of Systems  ?Constitutional: Negative.   ?Respiratory: Negative.    ?Cardiovascular: Negative.   ?Gastrointestinal: Negative.   ?Neurological: Negative.   ?Psychiatric/Behavioral: Negative.     ? ?Today's Vitals  ? 02/04/22 1044  ?BP: 120/64  ?Pulse: 63  ?Temp: 98.3 ?F (36.8 ?C)  ?TempSrc: Oral  ?Weight: 187 lb (84.8 kg)  ?Height: 5' 11.6" (1.819 m)  ? ?Body mass index is 25.65 kg/m?.  ? ?Objective:  ?Physical Exam ?Vitals reviewed.  ?Constitutional:   ?   General: He is not in acute distress. ?   Appearance: Normal appearance. He is obese.  ?HENT:  ?    Head: Normocephalic.  ?Cardiovascular:  ?   Rate and Rhythm: Normal rate and regular rhythm.  ?   Pulses: Normal pulses.  ?   Heart sounds: Normal heart sounds. No murmur heard. ?Pulmonary:  ?   Effort: Pulmonary effort is normal. No respiratory distress.  ?   Breath sounds: Normal breath sounds. No wheezing.  ?Skin: ?   General: Skin is warm and dry.  ?   Capillary Refill: Capillary refill takes less than 2 seconds.  ?Neurological:  ?   General: No focal deficit present.  ?   Mental Status: He is alert and oriented to person, place, and time.  ?Psychiatric:     ?   Mood and Affect: Mood normal.     ?   Behavior: Behavior normal.     ?   Thought Content: Thought content normal.     ?   Judgment: Judgment normal.  ?  ? ?   ?Assessment And Plan:  ?   ?1. Dyslipidemia ?Comments: No current medications, diet controlled.  ?- Lipid panel ? ?2. Need for shingles vaccine ?Shingrix #1 given today ?- Varicella-zoster vaccine IM (Shingrix) ? ?3. Encounter for screening colonoscopy ?According to USPTF Colorectal cancer Screening guidelines. Colonoscopy is recommended every 10 years, starting at age 15 years. ?Will refer to GI for colon cancer screening. ?-  Ambulatory referral to Gastroenterology ? ?4. History of COVID-19 ?Comments: Had in August 2022 overall doing well ?  ? ? ?Patient was given opportunity to ask questions. Patient verbalized understanding of the plan and was able to repeat key elements of the plan. All questions were answered to their satisfaction.  ?Arnette Felts, FNP  ? ?I, Arnette Felts, FNP, have reviewed all documentation for this visit. The documentation on 02/04/22 for the exam, diagnosis, procedures, and orders are all accurate and complete.  ? ?IF YOU HAVE BEEN REFERRED TO A SPECIALIST, IT MAY TAKE 1-2 WEEKS TO SCHEDULE/PROCESS THE REFERRAL. IF YOU HAVE NOT HEARD FROM US/SPECIALIST IN TWO WEEKS, PLEASE GIVE [place] A CALL AT [phone] X [phone].  ? ?THE PATIENT IS ENCOURAGED TO PRACTICE SOCIAL DISTANCING DUE  TO THE COVID-19 PANDEMIC.   ?

## 2022-02-04 NOTE — Patient Instructions (Signed)
Colonoscopy, Adult A colonoscopy is a procedure to look at the entire large intestine. This procedure is done using a long, thin, flexible tube that has a camera on theend. You may have a colonoscopy: As a part of normal colorectal screening. If you have certain symptoms, such as: A low number of red blood cells in your blood (anemia). Diarrhea that does not go away. Pain in your abdomen. Blood in your stool. A colonoscopy can help screen for and diagnose medical problems, including: Tumors. Extra tissue that grows where mucus forms (polyps). Inflammation. Areas of bleeding. Tell your health care provider about: Any allergies you have. All medicines you are taking, including vitamins, herbs, eye drops, creams, and over-the-counter medicines. Any problems you or family members have had with anesthetic medicines. Any blood disorders you have. Any surgeries you have had. Any medical conditions you have. Any problems you have had with having bowel movements. Whether you are pregnant or may be pregnant. What are the risks? Generally, this is a safe procedure. However, problems may occur, including: Bleeding. Damage to your intestine. Allergic reactions to medicines given during the procedure. Infection. This is rare. What happens before the procedure? Eating and drinking restrictions Follow instructions from your health care provider about eating or drinking restrictions, which may include: A few days before the procedure: Follow a low-fiber diet. Avoid nuts, seeds, dried fruit, raw fruits, and vegetables. 1-3 days before the procedure: Eat only gelatin dessert or ice pops. Drink only clear liquids, such as water, clear juice, clear broth or bouillon, black coffee or tea, or clear soft drinks or sports drinks. Avoid liquids that contain red or purple dye. The day of the procedure: Do not eat solid foods. You may continue to drink clear liquids until up to 2 hours before the  procedure. Do not eat or drink anything starting 2 hours before the procedure, or within the time period that your health care provider recommends. Bowel prep If you were prescribed a bowel prep to take by mouth (orally) to clean out your colon: Take it as told by your health care provider. Starting the day before your procedure, you will need to drink a large amount of liquid medicine. The liquid will cause you to have many bowel movements of loose stool until your stool becomes almost clear or light green. If your skin or the opening between the buttocks (anus) gets irritated from diarrhea, you may relieve the irritation using: Wipes with medicine in them, such as adult wet wipes with aloe and vitamin E. A product to soothe skin, such as petroleum jelly. If you vomit while drinking the bowel prep: Take a break for up to 60 minutes. Begin the bowel prep again. Call your health care provider if you keep vomiting or you cannot take the bowel prep without vomiting. To clean out your colon, you may also be given: Laxative medicines. These help you have a bowel movement. Instructions for enema use. An enema is liquid medicine injected into your rectum. Medicines Ask your health care provider about: Changing or stopping your regular medicines or supplements. This is especially important if you are taking iron supplements, diabetes medicines, or blood thinners. Taking medicines such as aspirin and ibuprofen. These medicines can thin your blood. Do not take these medicines unless your health care provider tells you to take them. Taking over-the-counter medicines, vitamins, herbs, and supplements. General instructions Ask your health care provider what steps will be taken to help prevent infection. These may include washing   skin with a germ-killing soap. Plan to have someone take you home from the hospital or clinic. What happens during the procedure?  An IV will be inserted into one of your  veins. You may be given one or more of the following: A medicine to help you relax (sedative). A medicine to numb the area (local anesthetic). A medicine to make you fall asleep (general anesthetic). This is rarely needed. You will lie on your side with your knees bent. The tube will: Have oil or gel put on it (be lubricated). Be inserted into your anus. Be gently eased through all parts of your large intestine. Air will be sent into your colon to keep it open. This may cause some pressure or cramping. Images will be taken with the camera and will appear on a screen. A small tissue sample may be removed to be looked at under a microscope (biopsy). The tissue may be sent to a lab for testing if any signs of problems are found. If small polyps are found, they may be removed and checked for cancer cells. When the procedure is finished, the tube will be removed. The procedure may vary among health care providers and hospitals. What happens after the procedure? Your blood pressure, heart rate, breathing rate, and blood oxygen level will be monitored until you leave the hospital or clinic. You may have a small amount of blood in your stool. You may pass gas and have mild cramping or bloating in your abdomen. This is caused by the air that was used to open your colon during the exam. Do not drive for 24 hours after the procedure. It is up to you to get the results of your procedure. Ask your health care provider, or the department that is doing the procedure, when your results will be ready. Summary A colonoscopy is a procedure to look at the entire large intestine. Follow instructions from your health care provider about eating and drinking before the procedure. If you were prescribed an oral bowel prep to clean out your colon, take it as told by your health care provider. During the colonoscopy, a flexible tube with a camera on its end is inserted into the anus and then passed into the other  parts of the large intestine. This information is not intended to replace advice given to you by your health care provider. Make sure you discuss any questions you have with your healthcare provider. Document Revised: 05/26/2019 Document Reviewed: 05/26/2019 Elsevier Patient Education  2022 Elsevier Inc.  

## 2022-02-05 LAB — LIPID PANEL
Chol/HDL Ratio: 2.9 ratio (ref 0.0–5.0)
Cholesterol, Total: 190 mg/dL (ref 100–199)
HDL: 66 mg/dL (ref >39)
LDL Chol Calc (NIH): 117 mg/dL — ABNORMAL HIGH (ref 0–99)
Triglycerides: 32 mg/dL (ref 0–149)
VLDL Cholesterol Cal: 7 mg/dL (ref 5–40)

## 2022-03-11 DIAGNOSIS — Z1211 Encounter for screening for malignant neoplasm of colon: Secondary | ICD-10-CM | POA: Diagnosis not present

## 2022-03-18 DIAGNOSIS — K635 Polyp of colon: Secondary | ICD-10-CM | POA: Diagnosis not present

## 2022-03-18 DIAGNOSIS — D123 Benign neoplasm of transverse colon: Secondary | ICD-10-CM | POA: Diagnosis not present

## 2022-03-18 DIAGNOSIS — Z1211 Encounter for screening for malignant neoplasm of colon: Secondary | ICD-10-CM | POA: Diagnosis not present

## 2022-03-18 LAB — HM COLONOSCOPY

## 2022-03-26 ENCOUNTER — Encounter: Payer: Self-pay | Admitting: Nurse Practitioner

## 2022-08-11 ENCOUNTER — Encounter: Payer: BC Managed Care – PPO | Admitting: Nurse Practitioner

## 2022-11-03 ENCOUNTER — Encounter: Payer: BC Managed Care – PPO | Admitting: Nurse Practitioner

## 2022-11-05 ENCOUNTER — Encounter: Payer: BC Managed Care – PPO | Admitting: Nurse Practitioner

## 2022-12-23 ENCOUNTER — Ambulatory Visit (INDEPENDENT_AMBULATORY_CARE_PROVIDER_SITE_OTHER): Payer: BC Managed Care – PPO | Admitting: Nurse Practitioner

## 2022-12-23 ENCOUNTER — Encounter: Payer: Self-pay | Admitting: Nurse Practitioner

## 2022-12-23 VITALS — BP 138/88 | HR 58 | Temp 98.4°F | Ht 71.5 in | Wt 193.0 lb

## 2022-12-23 DIAGNOSIS — H538 Other visual disturbances: Secondary | ICD-10-CM

## 2022-12-23 DIAGNOSIS — Z0001 Encounter for general adult medical examination with abnormal findings: Secondary | ICD-10-CM | POA: Diagnosis not present

## 2022-12-23 DIAGNOSIS — E785 Hyperlipidemia, unspecified: Secondary | ICD-10-CM

## 2022-12-23 DIAGNOSIS — Z125 Encounter for screening for malignant neoplasm of prostate: Secondary | ICD-10-CM | POA: Diagnosis not present

## 2022-12-23 DIAGNOSIS — E559 Vitamin D deficiency, unspecified: Secondary | ICD-10-CM

## 2022-12-23 DIAGNOSIS — I080 Rheumatic disorders of both mitral and aortic valves: Secondary | ICD-10-CM | POA: Diagnosis not present

## 2022-12-23 DIAGNOSIS — Z23 Encounter for immunization: Secondary | ICD-10-CM

## 2022-12-23 DIAGNOSIS — Z131 Encounter for screening for diabetes mellitus: Secondary | ICD-10-CM

## 2022-12-23 DIAGNOSIS — Z0000 Encounter for general adult medical examination without abnormal findings: Secondary | ICD-10-CM

## 2022-12-23 NOTE — Progress Notes (Signed)
I,Breniya Goertzen,acting as a Education administrator for [name], FNP.,have documented all relevant documentation on the behalf of [name], FNP,as directed by  [name], FNP while in the presence of [name], [place].   Subjective:     Patient ID: [name] [name] , male    DOB: November 13, 1971 , 52 y.o.   MRN: [id number]   Chief Complaint  Patient presents with   Annual Exam    HPI  Patient presents today for annual exam.       Past Medical History:  Diagnosis Date   GERD    LYMPHADENOPATHY    MIGRAINE HEADACHE    PERICARDITIS, ACUTE    POSITIVE PPD      Family History  Problem Relation Age of Onset   Hypertension Mother    Kidney disease Father      Current Outpatient Medications:    esomeprazole (NEXIUM) 40 MG capsule, Take 40 mg by mouth daily as needed., Disp: , Rfl:    Multiple Vitamins-Minerals (MULTIVITAMIN WITH MINERALS) tablet, Take 1 tablet by mouth daily., Disp: , Rfl:    Vitamin D, Ergocalciferol, (DRISDOL) 1.25 MG (50000 UNIT) CAPS capsule, Take 1 capsule (50,000 Units total) by mouth 2 (two) times a week., Disp: 24 capsule, Rfl: 1   No Known Allergies   Men's preventive visit. Patient Health Questionnaire (PHQ-2) is  [place] Office Visit from 12/23/2022 in [facility]  PHQ-2 Total Score 0     Patient is on a regular diet; admits need to cut back on his [place] bacon. He is drinking 4 bottles of water a day. Works night shift.  Marital status: Single. Relevant history for alcohol use is:  Social History   Substance and Sexual Activity  Alcohol Use Yes   Comment: rarely  . Relevant history for tobacco use is:  Social History   Tobacco Use  Smoking Status Never  Smokeless Tobacco Never  .   Review of Systems  Constitutional: Negative.   HENT: Negative.    Eyes: Negative.   Respiratory: Negative.    Cardiovascular: Negative.   Endocrine: Negative.   Genitourinary: Negative.   Musculoskeletal: Negative.    Skin: Negative.   Neurological: Negative.   Hematological: Negative.   Psychiatric/Behavioral: Negative.       Today's Vitals   12/23/22 1153  BP: 138/88  Pulse: (!) 58  Temp: 98.4 F (36.9 C)  TempSrc: Oral  SpO2: 98%  Weight: 193 lb (87.5 kg)  Height: 5' 11.5" (1.816 m)   Body mass index is 26.54 kg/m.   Objective:  Physical Exam Vitals reviewed.  Constitutional:      Appearance: Normal appearance. He is obese.  HENT:     Head: Normocephalic and atraumatic.     Right Ear: Tympanic membrane, ear canal and external ear normal. There is no impacted cerumen.     Left Ear: Tympanic membrane, ear canal and external ear normal. There is no impacted cerumen.  Cardiovascular:     Rate and Rhythm: Normal rate and regular rhythm.     Pulses: Normal pulses.     Heart sounds: Normal heart sounds. No murmur heard. Pulmonary:     Effort: Pulmonary effort is normal. No respiratory distress.     Breath sounds: Normal breath sounds.  Abdominal:     General: Abdomen is flat. Bowel sounds are normal. There is no distension.     Palpations: Abdomen is soft.  Genitourinary:    Prostate: Normal.  Rectum: Guaiac result negative.  Musculoskeletal:        General: Normal range of motion.     Cervical back: Normal range of motion and neck supple.  Skin:    General: Skin is warm.     Capillary Refill: Capillary refill takes less than 2 seconds.  Neurological:     General: No focal deficit present.     Mental Status: He is alert and oriented to person, place, and time.  Psychiatric:        Mood and Affect: Mood normal.        Behavior: Behavior normal.        Thought Content: Thought content normal.        Judgment: Judgment normal.         Assessment And Plan:    1. Encounter for health maintenance examination Behavior modifications discussed and diet history reviewed.   Pt will continue to exercise regularly and modify diet with low GI, plant based foods and decrease  intake of processed foods.  Recommend intake of daily multivitamin, Vitamin D, and calcium.  Recommend colonoscopy for preventive screenings, as well as recommend immunizations that include influenza, TDAP, and Shingles (#1 given in office will return in 2-6 weeks)  2. Encounter for immunization Comments: Shingrix #1 given in office - Varicella-zoster vaccine IM  3. Prostate cancer screening - PSA  4. Diabetes mellitus screening - Hemoglobin A1c  5. Dyslipidemia Comments: No current medications. Encouraged to limit intake of fried and fatty foods. - CMP14+EGFR - Lipid panel  6. Vitamin D deficiency Will check vitamin D level and supplement as needed.    Also encouraged to spend 15 minutes in the sun daily.  - CBC - VITAMIN D 25 Hydroxy (Vit-D Deficiency, Fractures)  7. Blurred vision Comments: He has been having worsening vision, will refer to opthalmology - Ambulatory referral to Ophthalmology   Patient was given opportunity to ask questions. Patient verbalized understanding of the plan and was able to repeat key elements of the plan. All questions were answered to their satisfaction.   [name], FNP   I, [name], FNP, have reviewed all documentation for this visit. The documentation on 12/23/22 for the exam, diagnosis, procedures, and orders are all accurate and complete.   THE PATIENT IS ENCOURAGED TO PRACTICE SOCIAL DISTANCING DUE TO THE COVID-19 PANDEMIC.

## 2022-12-23 NOTE — Patient Instructions (Addendum)
Health Maintenance, Male Adopting a healthy lifestyle and getting preventive care are important in promoting health and wellness. Ask your health care provider about: The right schedule for you to have regular tests and exams. Things you can do on your own to prevent diseases and keep yourself healthy. What should I know about diet, weight, and exercise? Eat a healthy diet  Eat a diet that includes plenty of vegetables, fruits, low-fat dairy products, and lean protein. Do not eat a lot of foods that are high in solid fats, added sugars, or sodium. Maintain a healthy weight Body mass index (BMI) is a measurement that can be used to identify possible weight problems. It estimates body fat based on height and weight. Your health care provider can help determine your BMI and help you achieve or maintain a healthy weight. Get regular exercise Get regular exercise. This is one of the most important things you can do for your health. Most adults should: Exercise for at least 150 minutes each week. The exercise should increase your heart rate and make you sweat (moderate-intensity exercise). Do strengthening exercises at least twice a week. This is in addition to the moderate-intensity exercise. Spend less time sitting. Even light physical activity can be beneficial. Watch cholesterol and blood lipids Have your blood tested for lipids and cholesterol at 52 years of age, then have this test every 5 years. You may need to have your cholesterol levels checked more often if: Your lipid or cholesterol levels are high. You are older than 52 years of age. You are at high risk for heart disease. What should I know about cancer screening? Many types of cancers can be detected early and may often be prevented. Depending on your health history and family history, you may need to have cancer screening at various ages. This may include screening for: Colorectal cancer. Prostate cancer. Skin cancer. Lung  cancer. What should I know about heart disease, diabetes, and high blood pressure? Blood pressure and heart disease High blood pressure causes heart disease and increases the risk of stroke. This is more likely to develop in people who have high blood pressure readings or are overweight. Talk with your health care provider about your target blood pressure readings. Have your blood pressure checked: Every 3-5 years if you are 18-39 years of age. Every year if you are 40 years old or older. If you are between the ages of 65 and 75 and are a current or former smoker, ask your health care provider if you should have a one-time screening for abdominal aortic aneurysm (AAA). Diabetes Have regular diabetes screenings. This checks your fasting blood sugar level. Have the screening done: Once every three years after age 45 if you are at a normal weight and have a low risk for diabetes. More often and at a younger age if you are overweight or have a high risk for diabetes. What should I know about preventing infection? Hepatitis B If you have a higher risk for hepatitis B, you should be screened for this virus. Talk with your health care provider to find out if you are at risk for hepatitis B infection. Hepatitis C Blood testing is recommended for: Everyone born from 1945 through 1965. Anyone with known risk factors for hepatitis C. Sexually transmitted infections (STIs) You should be screened each year for STIs, including gonorrhea and chlamydia, if: You are sexually active and are younger than 52 years of age. You are older than 52 years of age and your   health care provider tells you that you are at risk for this type of infection. Your sexual activity has changed since you were last screened, and you are at increased risk for chlamydia or gonorrhea. Ask your health care provider if you are at risk. Ask your health care provider about whether you are at high risk for HIV. Your health care provider  may recommend a prescription medicine to help prevent HIV infection. If you choose to take medicine to prevent HIV, you should first get tested for HIV. You should then be tested every 3 months for as long as you are taking the medicine. Follow these instructions at home: Alcohol use Do not drink alcohol if your health care provider tells you not to drink. If you drink alcohol: Limit how much you have to 0-2 drinks a day. Know how much alcohol is in your drink. In the [place] one drink equals one 12 oz bottle of beer (355 mL), one 5 oz glass of wine (148 mL), or one 1 oz glass of hard liquor (44 mL). Lifestyle Do not use any products that contain nicotine or tobacco. These products include cigarettes, chewing tobacco, and vaping devices, such as e-cigarettes. If you need help quitting, ask your health care provider. Do not use street drugs. Do not share needles. Ask your health care provider for help if you need support or information about quitting drugs. General instructions Schedule regular health, dental, and eye exams. Stay current with your vaccines. Tell your health care provider if: You often feel depressed. You have ever been abused or do not feel safe at home. Summary Adopting a healthy lifestyle and getting preventive care are important in promoting health and wellness. Follow your health care provider's instructions about healthy diet, exercising, and getting tested or screened for diseases. Follow your health care provider's instructions on monitoring your cholesterol and blood pressure. This information is not intended to replace advice given to you by your health care provider. Make sure you discuss any questions you have with your health care provider. Document Revised: 03/24/2021 Document Reviewed: 03/24/2021 Elsevier Patient Education  [place].   Zoster Vaccine Injection What is this medication? ZOSTER VACCINE (ZOS ter vak SEEN) reduces the risk of herpes  zoster (shingles). It does not treat shingles. It is still possible to get shingles after receiving the vaccine, but the symptoms may be less severe or not last as long. It works by helping your immune system learn how to fight off a future infection. This medicine may be used for other purposes; ask your health care provider or pharmacist if you have questions. COMMON BRAND NAME(S): [facility] What should I tell my care team before I take this medication? They need to know if you have any of these conditions: Cancer Immune system problems An unusual or allergic reaction to Zoster vaccine, other medications, foods, dyes, or preservatives Pregnant or trying to get pregnant Breastfeeding How should I use this medication? This vaccine is injected into a muscle. It is given by your care team. This vaccine requires 2 doses to get the full benefit. Set a reminder for when your next dose is due. A copy of Vaccine Information Statements will be given before each vaccination. Be sure to read this information carefully each time. This sheet may change often. Talk to your care team about the use of this vaccine in children. This vaccine is not approved for use in children. Overdosage: If you think you have taken too much of this  medicine contact a poison control center or emergency room at once. NOTE: This medicine is only for you. Do not share this medicine with others. What if I miss a dose? Keep appointments for follow-up (booster) doses. It is important not to miss your dose. Call your care team if you are unable to keep an appointment. What may interact with this medication? Medications that suppress your immune system Medications to treat cancer Steroid medications, such as prednisone or cortisone This list may not describe all possible interactions. Give your health care provider a list of all the medicines, herbs, non-prescription drugs, or dietary supplements you use. Also tell them if you smoke,  drink alcohol, or use illegal drugs. Some items may interact with your medicine. What should I watch for while using this medication? Visit your care team regularly. This vaccine, like all vaccines, may not fully protect everyone. What side effects may I notice from receiving this medication? Side effects that you should report to your care team as soon as possible: Allergic reactions--skin rash, itching, hives, swelling of the face, lips, tongue, or throat Side effects that usually do not require medical attention (report these to your care team if they continue or are bothersome): Chills Fatigue Feeling faint or lightheaded Fever Headache Muscle pain Pain, redness, or irritation at injection site This list may not describe all possible side effects. Call your doctor for medical advice about side effects. You may report side effects to FDA at 1-800-FDA-1088. Where should I keep my medication? This vaccine is only given by your care team. It will not be stored at home. NOTE: This sheet is a summary. It may not cover all possible information. If you have questions about this medicine, talk to your doctor, pharmacist, or health care provider.  2023 Elsevier/Gold Standard (2022-04-15 00:00:00)

## 2022-12-24 ENCOUNTER — Other Ambulatory Visit: Payer: Self-pay | Admitting: Nurse Practitioner

## 2022-12-24 DIAGNOSIS — E559 Vitamin D deficiency, unspecified: Secondary | ICD-10-CM

## 2022-12-24 LAB — CMP14+EGFR
ALT: 17 IU/L (ref 0–44)
AST: 20 IU/L (ref 0–40)
Albumin/Globulin Ratio: 1.7 (ref 1.2–2.2)
Albumin: 4.4 g/dL (ref 3.8–4.9)
Alkaline Phosphatase: 57 IU/L (ref 44–121)
BUN/Creatinine Ratio: 16 (ref 9–20)
BUN: 17 mg/dL (ref 6–24)
Bilirubin Total: 0.3 mg/dL (ref 0.0–1.2)
CO2: 26 mmol/L (ref 20–29)
Calcium: 9 mg/dL (ref 8.7–10.2)
Chloride: 104 mmol/L (ref 96–106)
Creatinine, Ser: 1.08 mg/dL (ref 0.76–1.27)
Globulin, Total: 2.6 g/dL (ref 1.5–4.5)
Glucose: 79 mg/dL (ref 70–99)
Potassium: 4 mmol/L (ref 3.5–5.2)
Sodium: 143 mmol/L (ref 134–144)
Total Protein: 7 g/dL (ref 6.0–8.5)
eGFR: 83 mL/min/{1.73_m2} (ref >59)

## 2022-12-24 LAB — HEMOGLOBIN A1C
Est. average glucose Bld gHb Est-mCnc: 108 mg/dL
Hgb A1c MFr Bld: 5.4 % (ref 4.8–5.6)

## 2022-12-24 LAB — CBC
Hematocrit: 44.1 % (ref 37.5–51.0)
Hemoglobin: 15.1 g/dL (ref 13.0–17.7)
MCH: 31.2 pg (ref 26.6–33.0)
MCHC: 34.2 g/dL (ref 31.5–35.7)
MCV: 91 fL (ref 79–97)
Platelets: 187 10*3/uL (ref 150–450)
RBC: 4.84 x10E6/uL (ref 4.14–5.80)
RDW: 13 % (ref 11.6–15.4)
WBC: 4 10*3/uL (ref 3.4–10.8)

## 2022-12-24 LAB — LIPID PANEL
Chol/HDL Ratio: 2.8 ratio (ref 0.0–5.0)
Cholesterol, Total: 207 mg/dL — ABNORMAL HIGH (ref 100–199)
HDL: 73 mg/dL (ref >39)
LDL Chol Calc (NIH): 123 mg/dL — ABNORMAL HIGH (ref 0–99)
Triglycerides: 59 mg/dL (ref 0–149)
VLDL Cholesterol Cal: 11 mg/dL (ref 5–40)

## 2022-12-24 LAB — PSA: Prostate Specific Ag, Serum: 0.7 ng/mL (ref 0.0–4.0)

## 2022-12-24 LAB — VITAMIN D 25 HYDROXY (VIT D DEFICIENCY, FRACTURES): Vit D, 25-Hydroxy: 17.6 ng/mL — ABNORMAL LOW (ref 30.0–100.0)

## 2022-12-24 MED ORDER — VITAMIN D (ERGOCALCIFEROL) 1.25 MG (50000 UNIT) PO CAPS: 50000.0000 [IU] | ORAL_CAPSULE | ORAL | 1 refills | Status: DC

## 2022-12-30 DIAGNOSIS — H538 Other visual disturbances: Secondary | ICD-10-CM | POA: Insufficient documentation

## 2022-12-30 HISTORY — DX: Other visual disturbances: H53.8

## 2024-03-09 ENCOUNTER — Ambulatory Visit: Admitting: Nurse Practitioner

## 2024-03-16 ENCOUNTER — Ambulatory Visit: Admitting: Nurse Practitioner

## 2024-04-05 NOTE — Progress Notes (Unsigned)
 [place], CMA,acting as a Neurosurgeon for [name], FNP.,have documented all relevant documentation on the behalf of [name], FNP,as directed by  [name], FNP while in the presence of [name], FNP.  Subjective:  Patient ID: [name] [name] , male    DOB: October 12, 1971 , 53 y.o.   MRN: [id number]  Chief Complaint  Patient presents with   Mass    Patient presents today for lump in his right jaw line, patient reports when he touches it it smells. Patient reports the lump isnt painful. Patient reports its been present for a couple years.     HPI  He has a lump to the right side of his chin, it is getting larger but not painful. Denies fever. The drainage from the area has an odor. He has not had any imaging done to the area     Past Medical History:  Diagnosis Date   Blurred vision 12/30/2022   He has been having worsening vision, will refer to opthalmology     Chest pain of uncertain etiology 02/21/2009   Low risk Myoview and normal echo Feb 2020        DYSPNEA 02/18/2010   Qualifier: Diagnosis of   By: Audery Blazing, MD, Penney Bowling        GERD    LYMPHADENOPATHY    MIGRAINE HEADACHE    Migraine headache 11/11/2006   Qualifier: Diagnosis of   By: Aldona Amel MD, Blanchie Bunkers SNOMED Dx Update Oct 2024     PERICARDITIS, ACUTE    POSITIVE PPD      Family History  Problem Relation Age of Onset   Hypertension Mother    Kidney disease Father      Current Outpatient Medications:    Multiple Vitamins-Minerals (MULTIVITAMIN WITH MINERALS) tablet, Take 1 tablet by mouth daily., Disp: , Rfl:    No Known Allergies   Review of Systems  Constitutional: Negative.   HENT:  Negative for ear discharge.   Respiratory: Negative.    Cardiovascular: Negative.   Musculoskeletal: Negative.   Skin:        Has lump to jaw line on right side  Neurological: Negative.   Psychiatric/Behavioral: Negative.       Today's Vitals   04/06/24 1548  BP: 120/80  Pulse:  (!) 59  Temp: 98.9 F (37.2 C)  TempSrc: Oral  Weight: 194 lb (88 kg)  Height: 5\' 11"  (1.803 m)  PainSc: 0-No pain   Body mass index is 27.06 kg/m.  Wt Readings from Last 3 Encounters:  04/06/24 194 lb (88 kg)  12/23/22 193 lb (87.5 kg)  02/04/22 187 lb (84.8 kg)    The 10-year ASCVD risk score (Arnett DK, et al., 2019) is: 4.7%   Values used to calculate the score:     Age: 60 years     Sex: Male     Is Non-Hispanic African American: Yes     Diabetic: No     Tobacco smoker: No     Systolic Blood Pressure: 120 mmHg     Is BP treated: No     HDL Cholesterol: 73 mg/dL     Total Cholesterol: 207 mg/dL  Objective:  Physical Exam Vitals and nursing note reviewed.  Constitutional:      General: He is not in acute distress.    Appearance: Normal appearance.  Pulmonary:     Effort: Pulmonary effort is normal. No respiratory distress.     Breath  sounds: No wheezing.  Skin:    General: Skin is warm.     Capillary Refill: Capillary refill takes less than 2 seconds.     Findings: No abscess or rash.     Comments: Right mandibular area with semi soft lump present, moveable, nontender.   Neurological:     General: No focal deficit present.     Mental Status: He is alert and oriented to person, place, and time.     Cranial Nerves: No cranial nerve deficit.     Motor: No weakness.  Psychiatric:        Mood and Affect: Mood normal.        Behavior: Behavior normal.        Thought Content: Thought content normal.        Judgment: Judgment normal.         Assessment And Plan:  Lump on face Assessment & Plan: Right mandibular area with semi soft lump present, moveable, nontender. Will check non thyroid soft tissue neck ultrasound. Cyst vs abscess. He is afebrile and nontender.   Orders: -     US  SOFT TISSUE HEAD & NECK (NON-THYROID); Future  Dyslipidemia -     Lipid panel  COVID-19 vaccination declined Assessment & Plan: Declines covid 19 vaccine. Discussed risk of  covid 73 and if he changes her mind about the vaccine to call the office. Education has been provided regarding the importance of this vaccine but patient still declined. Advised may receive this vaccine at local pharmacy or Health Dept.or vaccine clinic. Aware to provide a copy of the vaccination record if obtained from local pharmacy or Health Dept.  Encouraged to take multivitamin, vitamin d , vitamin c and zinc to increase immune system. Aware can call office if would like to have vaccine here at office. Verbalized acceptance and understanding.      Return in about 5 months (around 09/06/2024) for phy when able.  Patient was given opportunity to ask questions. Patient verbalized understanding of the plan and was able to repeat key elements of the plan. All questions were answered to their satisfaction.    Inge Mangle, FNP, have reviewed all documentation for this visit. The documentation on 04/06/24 for the exam, diagnosis, procedures, and orders are all accurate and complete.   IF YOU HAVE BEEN REFERRED TO A SPECIALIST, IT MAY TAKE 1-2 WEEKS TO SCHEDULE/PROCESS THE REFERRAL. IF YOU HAVE NOT HEARD FROM US /SPECIALIST IN TWO WEEKS, PLEASE GIVE US  A CALL AT [phone].

## 2024-04-06 ENCOUNTER — Ambulatory Visit: Admitting: Nurse Practitioner

## 2024-04-06 ENCOUNTER — Encounter: Payer: Self-pay | Admitting: Nurse Practitioner

## 2024-04-06 VITALS — BP 120/80 | HR 59 | Temp 98.9°F | Ht 71.0 in | Wt 194.0 lb

## 2024-04-06 DIAGNOSIS — R220 Localized swelling, mass and lump, head: Secondary | ICD-10-CM

## 2024-04-06 DIAGNOSIS — Z2821 Immunization not carried out because of patient refusal: Secondary | ICD-10-CM | POA: Diagnosis not present

## 2024-04-06 DIAGNOSIS — E785 Hyperlipidemia, unspecified: Secondary | ICD-10-CM

## 2024-04-06 NOTE — Assessment & Plan Note (Signed)
 Right mandibular area with semi soft lump present, moveable, nontender. Will check non thyroid soft tissue neck ultrasound. Cyst vs abscess. He is afebrile and nontender.

## 2024-04-06 NOTE — Assessment & Plan Note (Signed)

## 2024-04-07 LAB — LIPID PANEL
Chol/HDL Ratio: 3.1 ratio (ref 0.0–5.0)
Cholesterol, Total: 217 mg/dL — ABNORMAL HIGH (ref 100–199)
HDL: 70 mg/dL (ref >39)
LDL Chol Calc (NIH): 136 mg/dL — ABNORMAL HIGH (ref 0–99)
Triglycerides: 63 mg/dL (ref 0–149)
VLDL Cholesterol Cal: 11 mg/dL (ref 5–40)

## 2024-04-11 ENCOUNTER — Other Ambulatory Visit

## 2024-04-13 ENCOUNTER — Ambulatory Visit
Admission: RE | Admit: 2024-04-13 | Discharge: 2024-04-13 | Disposition: A | Discharge status: Home / Self Care | Source: Ambulatory Visit | Attending: Nurse Practitioner | Admitting: Nurse Practitioner

## 2024-04-13 DIAGNOSIS — R221 Localized swelling, mass and lump, neck: Secondary | ICD-10-CM | POA: Diagnosis not present

## 2024-04-13 DIAGNOSIS — R220 Localized swelling, mass and lump, head: Secondary | ICD-10-CM

## 2024-04-14 ENCOUNTER — Ambulatory Visit: Payer: Self-pay | Admitting: Nurse Practitioner

## 2024-09-21 ENCOUNTER — Encounter: Payer: Self-pay | Admitting: Nurse Practitioner

## 2025-01-18 ENCOUNTER — Encounter: Admitting: Nurse Practitioner
